# Patient Record
Sex: Female | Born: 1950 | Race: White | Hispanic: No | Smoking: Former smoker
Health system: Southern US, Community
[De-identification: ages and names within clinical notes are randomized; demographics above are authoritative.]

## PROBLEM LIST (undated history)

## (undated) DIAGNOSIS — E669 Obesity, unspecified: Secondary | ICD-10-CM

## (undated) DIAGNOSIS — C801 Malignant (primary) neoplasm, unspecified: Secondary | ICD-10-CM

## (undated) DIAGNOSIS — I739 Peripheral vascular disease, unspecified: Secondary | ICD-10-CM

## (undated) DIAGNOSIS — F329 Major depressive disorder, single episode, unspecified: Secondary | ICD-10-CM

## (undated) DIAGNOSIS — E559 Vitamin D deficiency, unspecified: Secondary | ICD-10-CM

## (undated) DIAGNOSIS — F419 Anxiety disorder, unspecified: Secondary | ICD-10-CM

## (undated) DIAGNOSIS — K219 Gastro-esophageal reflux disease without esophagitis: Secondary | ICD-10-CM

## (undated) DIAGNOSIS — E119 Type 2 diabetes mellitus without complications: Secondary | ICD-10-CM

## (undated) DIAGNOSIS — J449 Chronic obstructive pulmonary disease, unspecified: Secondary | ICD-10-CM

## (undated) DIAGNOSIS — G629 Polyneuropathy, unspecified: Secondary | ICD-10-CM

## (undated) DIAGNOSIS — I252 Old myocardial infarction: Secondary | ICD-10-CM

## (undated) DIAGNOSIS — E785 Hyperlipidemia, unspecified: Secondary | ICD-10-CM

## (undated) DIAGNOSIS — J309 Allergic rhinitis, unspecified: Secondary | ICD-10-CM

## (undated) DIAGNOSIS — M1990 Unspecified osteoarthritis, unspecified site: Secondary | ICD-10-CM

## (undated) DIAGNOSIS — I10 Essential (primary) hypertension: Secondary | ICD-10-CM

## (undated) DIAGNOSIS — L719 Rosacea, unspecified: Secondary | ICD-10-CM

## (undated) DIAGNOSIS — Z87442 Personal history of urinary calculi: Secondary | ICD-10-CM

## (undated) HISTORY — DX: Anxiety disorder, unspecified: F41.9

## (undated) HISTORY — DX: Obesity, unspecified: E66.9

## (undated) HISTORY — DX: Essential (primary) hypertension: I10

## (undated) HISTORY — DX: Major depressive disorder, single episode, unspecified: F32.9

## (undated) HISTORY — DX: Peripheral vascular disease, unspecified: I73.9

## (undated) HISTORY — PX: NASAL SEPTUM SURGERY: SHX37

## (undated) HISTORY — DX: Polyneuropathy, unspecified: G62.9

## (undated) HISTORY — PX: CHOLECYSTECTOMY: SHX55

## (undated) HISTORY — DX: Chronic obstructive pulmonary disease, unspecified: J44.9

## (undated) HISTORY — DX: Unspecified osteoarthritis, unspecified site: M19.90

## (undated) HISTORY — DX: Type 2 diabetes mellitus without complications: E11.9

## (undated) HISTORY — DX: Hyperlipidemia, unspecified: E78.5

## (undated) HISTORY — DX: Allergic rhinitis, unspecified: J30.9

## (undated) HISTORY — PX: TENNIS ELBOW RELEASE/NIRSCHEL PROCEDURE: SHX6651

## (undated) HISTORY — DX: Gastro-esophageal reflux disease without esophagitis: K21.9

## (undated) HISTORY — DX: Rosacea, unspecified: L71.9

## (undated) HISTORY — PX: APPENDECTOMY: SHX54

## (undated) HISTORY — DX: Vitamin D deficiency, unspecified: E55.9

## (undated) HISTORY — PX: ABDOMINAL HYSTERECTOMY: SHX81

## (undated) HISTORY — DX: Old myocardial infarction: I25.2

---

## 2011-12-25 HISTORY — PX: SKIN SURGERY: SHX2413

## 2014-10-29 ENCOUNTER — Ambulatory Visit (INDEPENDENT_AMBULATORY_CARE_PROVIDER_SITE_OTHER): Payer: PRIVATE HEALTH INSURANCE

## 2014-10-29 VITALS — BP 113/64 | HR 88 | Resp 12

## 2014-10-29 DIAGNOSIS — L03011 Cellulitis of right finger: Secondary | ICD-10-CM

## 2014-10-29 DIAGNOSIS — IMO0001 Reserved for inherently not codable concepts without codable children: Secondary | ICD-10-CM

## 2014-10-29 DIAGNOSIS — L03031 Cellulitis of right toe: Secondary | ICD-10-CM

## 2014-10-29 NOTE — Patient Instructions (Signed)
Diabetes and Foot Care Diabetes may cause you to have problems because of poor blood supply (circulation) to your feet and legs. This may cause the skin on your feet to become thinner, break easier, and heal more slowly. Your skin may become dry, and the skin may peel and crack. You may also have nerve damage in your legs and feet causing decreased feeling in them. You may not notice minor injuries to your feet that could lead to infections or more serious problems. Taking care of your feet is one of the most important things you can do for yourself.  HOME CARE INSTRUCTIONS  Wear shoes at all times, even in the house. Do not go barefoot. Bare feet are easily injured.  Check your feet daily for blisters, cuts, and redness. If you cannot see the bottom of your feet, use a mirror or ask someone for help.  Wash your feet with warm water (do not use hot water) and mild soap. Then pat your feet and the areas between your toes until they are completely dry. Do not soak your feet as this can dry your skin.  Apply a moisturizing lotion or petroleum jelly (that does not contain alcohol and is unscented) to the skin on your feet and to dry, brittle toenails. Do not apply lotion between your toes.  Trim your toenails straight across. Do not dig under them or around the cuticle. File the edges of your nails with an emery board or nail file.  Do not cut corns or calluses or try to remove them with medicine.  Wear clean socks or stockings every day. Make sure they are not too tight. Do not wear knee-high stockings since they may decrease blood flow to your legs.  Wear shoes that fit properly and have enough cushioning. To break in new shoes, wear them for just a few hours a day. This prevents you from injuring your feet. Always look in your shoes before you put them on to be sure there are no objects inside.  Do not cross your legs. This may decrease the blood flow to your feet.  If you find a minor scrape,  cut, or break in the skin on your feet, keep it and the skin around it clean and dry. These areas may be cleansed with mild soap and water. Do not cleanse the area with peroxide, alcohol, or iodine.  When you remove an adhesive bandage, be sure not to damage the skin around it.  If you have a wound, look at it several times a day to make sure it is healing.  Do not use heating pads or hot water bottles. They may burn your skin. If you have lost feeling in your feet or legs, you may not know it is happening until it is too late.  Make sure your health care provider performs a complete foot exam at least annually or more often if you have foot problems. Report any cuts, sores, or bruises to your health care provider immediately. SEEK MEDICAL CARE IF:   You have an injury that is not healing.  You have cuts or breaks in the skin.  You have an ingrown nail.  You notice redness on your legs or feet.  You feel burning or tingling in your legs or feet.  You have pain or cramps in your legs and feet.  Your legs or feet are numb.  Your feet always feel cold. SEEK IMMEDIATE MEDICAL CARE IF:   There is increasing redness,   swelling, or pain in or around a wound.  There is a red line that goes up your leg.  Pus is coming from a wound.  You develop a fever or as directed by your health care provider.  You notice a bad smell coming from an ulcer or wound. Document Released: 12/07/2000 Document Revised: 08/12/2013 Document Reviewed: 05/19/2013 ExitCare Patient Information 2015 [employer]. This information is not intended to replace advice given to you by your health care provider. Make sure you discuss any questions you have with your health care provider. ANTIBACTERIAL SOAP INSTRUCTIONS  THE DAY AFTER PROCEDURE  Please follow the instructions your doctor has marked.  Shower as usual. Before getting out, place a drop of antibacterial liquid soap (Dial) on a wet, clean washcloth.   Gently wipe washcloth over affected area.  Afterward, rinse the area with warm water.  Blot the area dry with a soft cloth and cover with antibiotic ointment (neosporin, polysporin, bacitracin) and band aid or gauze and tape Place 3-4 drops of antibacterial liquid soap in a quart of warm tap water.  Submerge foot into water for 20 minutes.  If bandage was applied after your procedure, leave on to allow for easy lift off, then remove and continue with soak for the remaining time.  Next, blot area dry with a soft cloth and cover with a bandage.  Apply other medications as directed by your doctor, such as cortisporin otic solution (eardrops) or neosporin antibiotic ointment 

## 2014-10-29 NOTE — Progress Notes (Signed)
   Subjective:    Patient ID: Erin Carlson, female    DOB: October 30, 1951, 63 y.o.   MRN: [id number]  HPI  PT STATED RT FOOT RT FOOT GREAT TOENAIL IS INFECTED AND SORE FOR 3 WEEKS. THE TOENAIL IS MUCH BETTER BUT WHEN PUTTING PRESSURE IT HURT. TRIED TAKING CEPHALAXINE AND IT HELP.  Review of Systems  Constitutional: Positive for unexpected weight change.  HENT: Positive for sneezing.   Respiratory: Positive for cough and shortness of breath.   Musculoskeletal: Positive for myalgias, back pain, joint swelling and gait problem.  Hematological: Bruises/bleeds easily.  Psychiatric/Behavioral: The patient is nervous/anxious.   All other systems reviewed and are negative.      Objective:   Physical Exam 63 year old white female well-developed well-nourished oriented 3 presents at this time with a history of possible infection lateral nail fold of right foot. Been there for approximately 3 weeks days her daughter who trims her nails trimmed her nails and a few days later while try to remove support or compression stockings a portion of the nail got caught and was pulled back. Increase in bleeding and subsequently developed some redness and drainage and erythema the nail fold. Examination this time reveals small granuloma lateral nail fold no purulence no ascending psoas patient is been on antibiotic regimen for just under week at this point cephalexin 500 mg. Lower extremity objective findings revealed ask her status to be intact pedal pulses palpable DP and PT +2 over 4 bilateral capillary refill time 3 seconds epicritic and proprioceptive sensations intact and symmetric bilateral there is normal plantar response and DTRs patient does have some hyperesthesia are some tingling or burning in her hands and feet at times although not a constant intact sensation is noted motor function is intact and symmetric bilateral nails somewhat criptotic incurvated although no discoloration no friability skin texture  turgor intact and color normal no excessive keratoses no open wounds or ulcers are noted however there is slight skin tear and appears to be a slight hangnail lateral right hallux nail fold. Remainder of exam unremarkable and noncontributory patient again is been applying Neosporin and Band-Aid and taking cephalexin as instructed       Assessment & Plan:  Assessment this time diabetes without complication at this time patient does have paronychia with hangnail lateral nail border right great toe the granulous glomus tissue and hyperkeratotic nail border or nail skin fold along the lateral nail border is debrided at this time after a slight Betadine prep. Small amount of bleeding is encountered was the granulomas tissue is debrided away from the lateral nail edges noted to be intact no break in the skin or the granulomatous tissue have been debrided away did leave a slight pinpoint bleeding which was treated with lumicain and Neosporin Band-Aid dressing applied to the hallux once again. Patient is given instructions for daily cleansing with soap and water and Neosporin and Band-Aid dressing. Advised to complete her anabolic regimen still is about 3-4 days to complete her cephalexin 500 mg 3 times a day. Patient will also maintain accommodative shoes if not resolved completely within the next week to 2 weeks to follow-up as needed next  Harriet Masson DPM

## 2016-03-19 DIAGNOSIS — R42 Dizziness and giddiness: Secondary | ICD-10-CM

## 2016-03-19 DIAGNOSIS — R0602 Shortness of breath: Secondary | ICD-10-CM

## 2016-03-19 DIAGNOSIS — M797 Fibromyalgia: Secondary | ICD-10-CM | POA: Insufficient documentation

## 2016-03-19 DIAGNOSIS — R002 Palpitations: Secondary | ICD-10-CM | POA: Insufficient documentation

## 2016-03-19 HISTORY — DX: Palpitations: R00.2

## 2016-03-19 HISTORY — DX: Shortness of breath: R06.02

## 2016-03-19 HISTORY — DX: Dizziness and giddiness: R42

## 2016-03-19 HISTORY — DX: Fibromyalgia: M79.7

## 2016-06-20 DIAGNOSIS — R899 Unspecified abnormal finding in specimens from other organs, systems and tissues: Secondary | ICD-10-CM | POA: Insufficient documentation

## 2016-06-20 HISTORY — DX: Unspecified abnormal finding in specimens from other organs, systems and tissues: R89.9

## 2017-04-15 DIAGNOSIS — Z01810 Encounter for preprocedural cardiovascular examination: Secondary | ICD-10-CM | POA: Insufficient documentation

## 2017-08-02 ENCOUNTER — Encounter: Payer: Medicare Other | Attending: Psychology | Admitting: Psychology

## 2017-08-02 DIAGNOSIS — E785 Hyperlipidemia, unspecified: Secondary | ICD-10-CM | POA: Diagnosis not present

## 2017-08-02 DIAGNOSIS — Z8673 Personal history of transient ischemic attack (TIA), and cerebral infarction without residual deficits: Secondary | ICD-10-CM | POA: Diagnosis not present

## 2017-08-02 DIAGNOSIS — F419 Anxiety disorder, unspecified: Secondary | ICD-10-CM | POA: Insufficient documentation

## 2017-08-02 DIAGNOSIS — M1990 Unspecified osteoarthritis, unspecified site: Secondary | ICD-10-CM | POA: Insufficient documentation

## 2017-08-02 DIAGNOSIS — G894 Chronic pain syndrome: Secondary | ICD-10-CM | POA: Diagnosis not present

## 2017-08-02 DIAGNOSIS — J449 Chronic obstructive pulmonary disease, unspecified: Secondary | ICD-10-CM | POA: Insufficient documentation

## 2017-08-02 DIAGNOSIS — Z79899 Other long term (current) drug therapy: Secondary | ICD-10-CM | POA: Insufficient documentation

## 2017-08-02 DIAGNOSIS — M797 Fibromyalgia: Secondary | ICD-10-CM | POA: Diagnosis not present

## 2017-08-02 DIAGNOSIS — E119 Type 2 diabetes mellitus without complications: Secondary | ICD-10-CM | POA: Insufficient documentation

## 2017-08-02 DIAGNOSIS — I10 Essential (primary) hypertension: Secondary | ICD-10-CM | POA: Insufficient documentation

## 2017-08-02 DIAGNOSIS — G8929 Other chronic pain: Secondary | ICD-10-CM | POA: Diagnosis present

## 2017-08-02 DIAGNOSIS — F329 Major depressive disorder, single episode, unspecified: Secondary | ICD-10-CM | POA: Diagnosis not present

## 2017-08-02 NOTE — Progress Notes (Signed)
Neuropsychological Consultation   Carlson:   Erin Carlson   DOB:   [birth year]  MR Number:  [id number]  Location:  [facility] [address], [place] [id number] [place] [address] Dept: [phone]           Date of Service:   08/02/2017  Start Time:   9 AM End Time:   10 AM  Provider/Observer:  Ilean Skill, Psy.D.       Clinical Neuropsychologist       Billing Code/Service: [phone] [id number] Units  Chief Complaint:    Erin Carlson was referred by Dr. Maryjean Ka for psychological evaluation as part of Erin standard protocol for evaluation for possible spinal cord stimulator trialing and implantation. 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   Reason for Service:  Erin Carlson is a 66 year old Caucasian female referred by Dr. Luan Pulling for psychological evaluation as part of Erin standard protocol for assessment for possible spinal cord stimulator trialing and possible implantation. Erin Carlson had a back injury in 1992.  Over Erin years she has had many attempts at intervention for her chronic back pain including various shots, physical therapy, chiropractic manipulation and other interventions. She reports that steroids she took caused a great deal of weight gain. Erin Carlson also developed fibromyalgia type symptoms after Erin development of his chronic pain. Erin Carlson reports that Erin fibromyalgia symptoms have been doing much better over time. Erin Carlson does acknowledge that she has times where she gets upset that she is not able to do Erin things that she has done but these are very specific to her frustration and not appear to be part of any significant major depressive event. However,  Erin Carlson has been prescribed Prozac for issues both related to this frustration and depression as well as fibromyalgia symptoms.  Historically, Erin Carlson did have some struggles back in 2006 with regard to depression. During that time her husband left her for a 9 year old and left Erin Carlson in significant financial difficulty with over $100,000 in credit card debt that Erin Carlson did not know was outstanding at Erin time her husband left. Over time she was able to clear this up. Erin Carlson is now on disability and she was able to go through bankruptcy and get these financial issues cleared up. She reports that she doesn't have any stress from those issues ongoing.  Erin Carlson describes Erin major stressors that she has right now is been unable to do normal things like walking or doing Erin dishes and other household chores. She reports that she has back pain and pain going down her right leg and also at times sometimes down her left leg. She describes moderate to significant symptoms of anxiety, stress, appetite changes, sleep disturbance, working issues, insomnia, and low energy.  Current Status:  Erin Carlson has ongoing chronic back pain.  Reliability of Information: Erin information is provided through 1 hour clinical face-to-face interview with Erin Carlson as well as review of available medical records.  Behavioral Observation: Erin Carlson  presents as a 67 y.o.-year-old Right Caucasian Female who appeared her stated age. her dress was Appropriate and she was Well Groomed and her manners were Appropriate to Erin situation.  her  participation was indicative of Appropriate and Attentive behaviors.  There were any physical disabilities noted.  she displayed an appropriate level of cooperation and motivation.     Interactions:    Active Appropriate and Attentive  Attention:   within normal limits and attention span and concentration were age appropriate  Memory:   within normal limits; recent  and remote memory intact  Visuo-spatial:  within normal limits  Speech (Volume):  normal  Speech:   normal;   Thought Process:  Coherent and Relevant  Though Content:  WNL;   Orientation:   person, place, time/date and situation  Judgment:   Good  Planning:   Good  Affect:    Appropriate  Mood:    Anxious  Insight:   Good  Intelligence:   normal  Marital Status/Living: Erin Carlson was born in [place] and grew up in [place]. She is divorced. Erin Carlson is a 46 year old daughter, a 79 year old son, 39 year old son, 1 year old son, and a daughter who is deceased. She currently lives by herself with her dog. She is divorced.  Current Employment: Erin Carlson is disabled at this point and retired because of her back pain.  Past Employment:  Erin Carlson worked for many years in [employer] and also worked in Corporate treasurer, worked as a Chief Operating Officer, and also in Office manager.  Substance Use:  No concerns of substance abuse are reported.    Education:   Engineering geologist History:   Past Medical History:  Diagnosis Date  . Anxiety   . Arthritis   . COPD (chronic obstructive pulmonary disease)   . Diabetes mellitus without complication   . Hyperlipidemia   . Hypertension   . Stroke         Abuse/Trauma History: Erin Carlson denies any significant history of trauma or abuse although she did experience a major stressor in 2006 when her husband left her for a 68 year old female and left Erin Carlson in significant financial distress.  Psychiatric History:  Erin Carlson has been treated for depression and stress back in 2001 as well as 2006. She is also been diagnosed with fibromyalgia as well. Erin Carlson reports that she is doing well now and responds very well to fluoxetine.  Family Med/Psych History: No family history on file.  Risk of Suicide/Violence: virtually non-existent Erin Carlson denies any suicidal or homicidal ideation.  Impression/DX:  Erin  Carlson was referred by Dr. Maryjean Ka for psychological evaluation as part of Erin standard protocol for evaluation for possible spinal cord stimulator trialing and implantation. 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  Erin Carlson's mental status appears to be quite good and she was well oriented. There does not appear to be any overtly disruptive psychological/psychiatric issues at this time although she does have frustration and some mild depression. Erin Carlson appears to be well adjusted and has no history of any significant psychiatric issues except for depressive events related to psychosocial stressors.  Disposition/Plan:  Erin Carlson will complete Erin [place] multiphasic personality inventory as well as Erin P3 inventory as part of this objective psychological evaluation.  Diagnosis:    Chronic pain syndrome  Fibromyalgia         Electronically Signed   _______________________ Ilean Skill, Psy.D.

## 2017-08-08 ENCOUNTER — Encounter: Payer: Medicare Other | Admitting: Psychology

## 2017-08-09 ENCOUNTER — Encounter: Payer: Medicare Other | Admitting: Psychology

## 2017-08-22 ENCOUNTER — Telehealth: Payer: Self-pay | Admitting: Psychology

## 2017-08-22 ENCOUNTER — Encounter (HOSPITAL_BASED_OUTPATIENT_CLINIC_OR_DEPARTMENT_OTHER): Payer: Medicare Other | Admitting: Psychology

## 2017-08-22 DIAGNOSIS — G894 Chronic pain syndrome: Secondary | ICD-10-CM

## 2017-08-22 DIAGNOSIS — M797 Fibromyalgia: Secondary | ICD-10-CM | POA: Diagnosis not present

## 2017-08-22 NOTE — Progress Notes (Signed)
Patient:  Erin Carlson   DOB: [birth year]  MR Number: [id number]  Location: [facility] [address] [place] [address] Dept: [phone]  Start: 3 PM End: 4 PM  Provider/Observer:     Edgardo Roys PSYD  Chief Complaint:      Chief Complaint  Patient presents with  . Pain    Reason For Service:     Harbor Paster is a 66 year old Caucasian female referred by Dr. Luan Pulling for psychological evaluation as part of the standard protocol for assessment for possible spinal cord stimulator trialing and possible implantation. The patient had a back injury in 1992.  Over the years she has had many attempts at intervention for her chronic back pain including various shots, physical therapy, chiropractic manipulation and other interventions. She reports that steroids she took caused a great deal of weight gain. The patient also developed fibromyalgia type symptoms after the development of his chronic pain. The patient reports that the fibromyalgia symptoms have been doing much better over time. The patient does acknowledge that she has times where she gets upset that she is not able to do the things that she has done but these are very specific to her frustration and not appear to be part of any significant major depressive event. However, the patient has been prescribed Prozac for issues both related to this frustration and depression as well as fibromyalgia symptoms.  Historically, the patient did have some struggles back in 2006 with regard to depression. During that time her husband left her for a 17 year old and left the patient in significant financial difficulty with over $100,000 in credit card debt that the patient did not know was outstanding at the time her husband left. Over time she was able to clear this up. The patient is now on disability and she was able to go  through bankruptcy and get these financial issues cleared up. She reports that she doesn't have any stress from those issues ongoing.  The patient describes the major stressors that she has right now is been unable to do normal things like walking or doing the dishes and other household chores. She reports that she has back pain and pain going down her right leg and also at times sometimes down her left leg. She describes moderate to significant symptoms of anxiety, stress, appetite changes, sleep disturbance, working issues, insomnia, and low energy.  Testing Administered:  The patient completed the [place] multiphasic personality inventory-2 as well as the pain patient profile.  Participation Level:   Active  Participation Quality:  Appropriate and Attentive      Behavioral Observation:  Well Groomed, Alert, and Appropriate.   Test Results:   Initially, the patient completed the [place] multiphasic personality inventory. The resulting validity scales do suggest that the patient may have attempted to minimize some of the overall difficulties and stressors that she has been dealing with but this is not to an extreme level. The patient did attempt to place herself in a positive light but at a level that would allow interpretation of the basic clinical scales.  The resulting clinical scales do show specific and targeted elevations in both very vague as well as specific health concerns and physical difficulties. The patient denies any significant level of depression or anxiety or other significant psychiatric conditions. The issues that the patient likely minimized had to do with frustration and difficulties coping with her level  of pain and difficulties. However, it does not appear to be an exaggeration of her physical pain are physical difficulties and does not suggest any type of somatic sedation or somatoform disorder. The patient does not appear to have any significant psychiatric illness or  overly disruptive psychiatric/psychological symptomatology.  Further analysis utilizing supplementary scales do show elevations on measures related to a great deal of coping resources being utilized to try to control anger and hostility feelings and a need to feel dominant in the world that she is feeling like she does not have much control over. This hostility and anger may be tied in December the psychosocial features that she has face the years including issues related to a very difficult into her marriage where her husband left and left the patient in significant financial difficulties through direct manipulation of her portion her into bankruptcy.  There are no indications of significant PTSD or other history of extreme trauma or traumatic experiences. The patient does not have an elevation in measures that are associated with vulnerability to substance abuse.  The patient then completed the pain patient profile. This is an objective measure that helps adjust for some of the elevations that can be found on the MMPI-2 related to chronic pain patients as the normative samples for the MMPI 2 do not contain chronic pain patients. The patient does appear to produced a valid assessment. The patient's level of somatic/physical complaints are significantly above a community-based normative sample. However, there accident below the mean level typically found in a chronic pain patient sample that does not contain individuals with significant psychiatric illness. The patient also had a lower level of anxiety than both the community-based sample or the chronic pain patient population. She did have increased levels of symptoms related to depressive features relative to a normative/community sample but below those typically seen in chronic pain patients.  Summary of Results:   Results of the current psychological evaluation that include a 1 hour face-to-face clinical interview, review of available medical records, as  well as objective assessments including the MMPI-2 as well as the pain patient profile are consistent with an individual who has had some difficulties with depressive symptoms but has been working very hard to manage and cope with these symptoms. The patient has some degree of underlying anger and hostility that may be related to factors from 2006 or other factors associated with situations where important people in her life have taken control away from her and left are very angry and frustrated. The level of depressive symptoms are not significant or severe and are not indicative of significant underlying psychiatric illness. There are no indications of significant anxiety, psychosis, or manic types of symptoms. There also does not appear to be significant vulnerability for substance abuse. The patient's profile is consistent with an individual with chronic pain symptoms but there are also some vague symptoms described that are consistent with a prior diagnosis of fibromyalgia. Overall, there do not appear to be any significant psychiatric or psychosocial variables that would be overly deleterious to the trialing. Up the spinal cord stimulator or disruptive if actual implantation was completed.  Impression/Diagnosis:   The results of the current objective psychological evaluation are consistent with an individual who would be a good candidate for spinal cord stimulator trialing and possible implantation. There do not appear to be any significant psychological/psychiatric variables or psychosocial variables that would be overly disruptive and not allow for accurate descriptions from the patient regarding the trialing phase. There  also do not appear to be significant issues that would disrupt or impede her ability to proceed with implantation of that was warranted. The patient does have some mild to moderate symptoms of depression that appear to be well managed and not particularly acute at this time. The patient  does also have some degree of underlying anger and hostility likely from life experiences and the patient is using a reasonable amount of coping resources to try to cope with and deal with this hostility from being externalized. The patient may benefit from working on some of the symptoms although she does report that she has been coping much better with the disruptive events in her life dating back to 2006 when her husband left her after 22 years and left are insignificant severe financial distress.  Diagnosis:    Axis I: Chronic pain syndrome  Fibromyalgia   Ilean Skill, Psy.D. Neuropsychologist

## 2017-08-22 NOTE — Telephone Encounter (Signed)
Erin Carlson STILL NEEDS NOTES FOR DR Cary

## 2017-10-01 ENCOUNTER — Other Ambulatory Visit: Payer: Self-pay | Admitting: Anesthesiology

## 2017-10-23 NOTE — Pre-Procedure Instructions (Signed)
Maisha Juma  10/23/2017      [facility], [place] [address] Phone: [phone] Fax: [phone]    Your procedure is scheduled on Friday November 9.  Report to [facility] Admitting at 7:30 A.M.  Call this number if you have problems the morning of surgery:  [phone]   Remember:  Do not eat food or drink liquids after midnight.  Take these medicines the morning of surgery with A SIP OF WATER:  Metoprolol (lopressor) Omeprazole (prilosec) pregabalin (lyrica) Fluoxetine (prozac) Albuterol Inhaler if needed Acetaminophen (tylenol) if needed Cyclobenzaprine (flexeril) if needed  7 days prior to surgery STOP taking any Aspirin (unless otherwise instructed by your surgeon), Aleve, Naproxen, Ibuprofen, Motrin, Advil, Goody's, BC's, all herbal medications, fish oil, and all vitamins    Do not wear jewelry, make-up or nail polish.  Do not wear lotions, powders, or perfumes, or deoderant.  Do not shave 48 hours prior to surgery.  Men may shave face and neck.  Do not bring valuables to the hospital.  [facility] is not responsible for any belongings or valuables.  Contacts, dentures or bridgework may not be worn into surgery.  Leave your suitcase in the car.  After surgery it may be brought to your room.  For patients admitted to the hospital, discharge time will be determined by your treatment team.  Patients discharged the day of surgery will not be allowed to drive home.    Special instructions:    [facility]- Preparing For Surgery  Before surgery, you can play an important role. Because skin is not sterile, your skin needs to be as free of germs as possible. You can reduce the number of germs on your skin by washing with CHG (chlorahexidine gluconate) Soap before surgery.  CHG is an antiseptic cleaner which kills germs and bonds with the skin to continue killing germs even after  washing.  Please do not use if you have an allergy to CHG or antibacterial soaps. If your skin becomes reddened/irritated stop using the CHG.  Do not shave (including legs and underarms) for at least 48 hours prior to first CHG shower. It is OK to shave your face.  Please follow these instructions carefully.   1. Shower the NIGHT BEFORE SURGERY and the MORNING OF SURGERY with CHG.   2. If you chose to wash your hair, wash your hair first as usual with your normal shampoo.  3. After you shampoo, rinse your hair and body thoroughly to remove the shampoo.  4. Use CHG as you would any other liquid soap. You can apply CHG directly to the skin and wash gently with a scrungie or a clean washcloth.   5. Apply the CHG Soap to your body ONLY FROM THE NECK DOWN.  Do not use on open wounds or open sores. Avoid contact with your eyes, ears, mouth and genitals (private parts). Wash Face and genitals (private parts)  with your normal soap.  6. Wash thoroughly, paying special attention to the area where your surgery will be performed.  7. Thoroughly rinse your body with warm water from the neck down.  8. DO NOT shower/wash with your normal soap after using and rinsing off the CHG Soap.  9. Pat yourself dry with a CLEAN TOWEL.  10. Wear CLEAN PAJAMAS to bed the night before surgery, wear comfortable clothes the morning of surgery  11. Place CLEAN SHEETS on  your bed the night of your first shower and DO NOT SLEEP WITH PETS.    Day of Surgery: Do not apply any deodorants/lotions. Please wear clean clothes to the hospital/surgery center.      Please read over the following fact sheets that you were given. Coughing and Deep Breathing and MRSA Information

## 2017-10-24 ENCOUNTER — Encounter (HOSPITAL_COMMUNITY)
Admission: RE | Admit: 2017-10-24 | Discharge: 2017-10-24 | Disposition: A | Discharge status: Home / Self Care | Payer: Medicare Other | Source: Ambulatory Visit | Attending: Anesthesiology | Admitting: Anesthesiology

## 2017-10-24 ENCOUNTER — Encounter (HOSPITAL_COMMUNITY): Payer: Self-pay

## 2017-10-24 DIAGNOSIS — G894 Chronic pain syndrome: Secondary | ICD-10-CM | POA: Diagnosis not present

## 2017-10-24 DIAGNOSIS — Z01818 Encounter for other preprocedural examination: Secondary | ICD-10-CM | POA: Insufficient documentation

## 2017-10-24 DIAGNOSIS — M797 Fibromyalgia: Secondary | ICD-10-CM | POA: Diagnosis not present

## 2017-10-24 DIAGNOSIS — Z7982 Long term (current) use of aspirin: Secondary | ICD-10-CM | POA: Insufficient documentation

## 2017-10-24 DIAGNOSIS — Z79899 Other long term (current) drug therapy: Secondary | ICD-10-CM | POA: Diagnosis not present

## 2017-10-24 HISTORY — DX: Personal history of urinary calculi: Z87.442

## 2017-10-24 HISTORY — PX: EYE SURGERY: SHX253

## 2017-10-24 HISTORY — DX: Malignant (primary) neoplasm, unspecified: C80.1

## 2017-10-24 LAB — BASIC METABOLIC PANEL
Anion gap: 8 (ref 5–15)
BUN: 17 mg/dL (ref 6–20)
CALCIUM: 9.4 mg/dL (ref 8.9–10.3)
CO2: 25 mmol/L (ref 22–32)
CREATININE: 1.08 mg/dL — AB (ref 0.44–1.00)
Chloride: 104 mmol/L (ref 101–111)
GFR calc non Af Amer: 52 mL/min — ABNORMAL LOW (ref >60)
GFR, EST AFRICAN AMERICAN: 60 mL/min — AB (ref >60)
GLUCOSE: 111 mg/dL — AB (ref 65–99)
Potassium: 4.8 mmol/L (ref 3.5–5.1)
Sodium: 137 mmol/L (ref 135–145)

## 2017-10-24 LAB — CBC
HEMATOCRIT: 44.8 % (ref 36.0–46.0)
Hemoglobin: 14.5 g/dL (ref 12.0–15.0)
MCH: 28.5 pg (ref 26.0–34.0)
MCHC: 32.4 g/dL (ref 30.0–36.0)
MCV: 88 fL (ref 78.0–100.0)
Platelets: 198 10*3/uL (ref 150–400)
RBC: 5.09 MIL/uL (ref 3.87–5.11)
RDW: 14.6 % (ref 11.5–15.5)
WBC: 7.9 10*3/uL (ref 4.0–10.5)

## 2017-10-24 LAB — PROTIME-INR
INR: 1
Prothrombin Time: 13.1 seconds (ref 11.4–15.2)

## 2017-10-24 LAB — SURGICAL PCR SCREEN
MRSA, PCR: NEGATIVE
STAPHYLOCOCCUS AUREUS: POSITIVE — AB

## 2017-10-24 LAB — HEMOGLOBIN A1C
Hgb A1c MFr Bld: 6.7 % — ABNORMAL HIGH (ref 4.8–5.6)
Mean Plasma Glucose: 145.59 mg/dL

## 2017-10-24 LAB — GLUCOSE, CAPILLARY: Glucose-Capillary: 105 mg/dL — ABNORMAL HIGH (ref 65–99)

## 2017-10-24 LAB — APTT: aPTT: 29 seconds (ref 24–36)

## 2017-10-24 MED ORDER — CHLORHEXIDINE GLUCONATE CLOTH 2 % EX PADS: 6.0000 | MEDICATED_PAD | Freq: Once | CUTANEOUS | Status: DC

## 2017-10-24 NOTE — Progress Notes (Addendum)
PCP: Heide Scales, NP  Cardiologist: Dr. Clea[facility] in [place] (pt believes this MD has left the practice but she saw him at this practice and has only been seen at this practice for cardiology)  EKG: 03/2017-requested  Stress test: 04/16/17-care everywhere-requested  ECHO: 03/27/16 -care everywhere-requested  Cardiac Cath: pt denies  Chest x-ray: pt denies past year, no recent respiratory complications

## 2017-10-25 NOTE — Progress Notes (Addendum)
Anesthesia Chart Review:  Pt is a 66 year old female scheduled for lumbar spinal cord stimulator insertion on 11/01/2017 with Clydell Hakim, M.D.  - PCP is Heide Scales, NP  PMH includes: HTN, DM, hyperlipidemia, COPD. Former smoker. BMI 39.   Medications include: Albuterol, ASA 81 mg, exenatide, metoprolol, Prilosec, pravastatin  BP 107/63   Pulse 79   Temp 36.5 C (Oral)   Resp 18   Ht 5\' 3"  (1.6 m)   Wt 220 lb 7 oz (100 kg)   SpO2 95%   BMI 39.05 kg/m    Preoperative labs reviewed.  HbA1c 6.7, glucose 111  EKG will be obtained day of surgery  Nuclear stress test 04/16/17 ([place]):  - Printed copy illegible.  I spoke to office. Report states " no evidence of ischemia. Preserved systolic function. EF 57%"  Echo 03/27/16:  - by notes in care everywhere (Dr. Agustin Cree 05/09/16), "Echo [place] showed ejection fraction 45-50"  Holter monitor 03/27/16:  1.Essentially normal, no significant abnormalities Holter monitor.  2.Arrhythmia as described 3.Symptoms were not reported  4.Symptoms were not correlated with arrhythmias  If EKG acceptable, I anticipate pt can proceed with surgery as scheduled.    Willeen Cass, FNP-BC [facility]/Anesthesiology Phone: [phone] 10/31/2017 9:57 AM

## 2017-10-31 NOTE — H&P (Signed)
Erin Carlson is an 66 y.o. female.   Chief Complaint: Back pain with radiation into the lower extremities, right substantially more than left HPI: Erin Carlson is a very pleasant obese 66 year old right-handed Caucasian female who presents to the clinic today with a past medical history significant for anxiety, depression, hypertension, high cholesterol, GERD, liver disease, arthritis, diabetes, renal disease, osteoporosis as well as COPD.  She is managed for chronic pain by Leonie Green, NP.  She receives multiple medications to include naproxen, gabapentin, amitriptyline, Lyrica and Amitiza.  She was sent to our clinic for consideration of spinal cord stimulation therapy.  The patient has had the opportunity to speak with one of our representatives on the phone.  She was able to get some of her questions answered.  Today the patient presents essentially complaining of pain all across her low back and radiating down into her right lower extremity.  She has pain radiating into both the right and left hip.  She states that while the symptoms are worse on the right they are beginning to progress on left.  She has pain radiating all the way down into the right foot.  She would describe the pain as a sharp shooting sensation with a fairly constant achy gnawing pain. She rates her pain approximately an 8/10.  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  Her functionality has slowly declined the patient is now disabled.  The she has pain when lying, sitting, standing or walking.  She finds that medications, hot showers and Aspercreme helps to reduce the symptoms.  She has difficulty tolerating pain medications such as oxycodone or hydrocodone as she has allergies with these.  She finds activities such as walking, which she previously really enjoyed, to be very difficult.  She used to walk 2 mile circuit with her dog but is now unable to walk even short  distances without severe pain. Activities that require standing or sitting for any length of time such as driving or washing dishes aggravate her pain.  She states she cannot sit more than about a half an hour, stand more than 10 to 20 minutes or walk more than 3 minutes without having to sit to rest.  She reports no bowel or bladder dysfunction.  She has trialed 15 to 20 physical therapy sessions with no benefit.  She also reports trialing 12 to 15 injections of various kinds again with no benefit.  She states the most recent injection actually caused her to have fairly severe pain for about a week afterwards before her pain returned to its baseline.    With this history, and  With no reported absolute surgical lesion, she was referred for S CS trial.  She underwent that trial, and returned reporting some 90 percent improvement in her back pain and leg pain.  She now presents for permanent implantation of SCS system   Past Medical History:  Diagnosis Date  . Anxiety   . Arthritis   . Cancer ([place])    basal cell 2016  . COPD (chronic obstructive pulmonary disease) ([place])   . Diabetes mellitus without complication ([place])   . History of kidney stones    10/2016  . Hyperlipidemia   . Hypertension     Past Surgical History:  Procedure Laterality Date  . ABDOMINAL HYSTERECTOMY     1989  . CHOLECYSTECTOMY     1982  . NASAL SEPTUM SURGERY  2018   . [place]    History reviewed. No pertinent family history. Social History:  reports that she quit smoking about 11 years ago. She has a 30.00 pack-year smoking history. she has never used smokeless tobacco. She reports that she does not drink alcohol or use drugs.  Allergies:  Allergies  Allergen Reactions  . Cortisone Hives  . Hydrocodone Hives  . Oxycodone Hives  . Paxil [Paroxetine Hcl]     Hypotension   . Tramadol Hives    Medications Prior to Admission  Medication Sig Dispense Refill  .  acetaminophen (TYLENOL) 650 MG CR tablet Take 650 mg by mouth every 8 (eight) hours as needed for pain.    Marland Kitchen albuterol (PROAIR HFA) 108 (90 BASE) MCG/ACT inhaler Inhale 2 puffs into the lungs every 6 (six) hours as needed for wheezing or shortness of breath.     Marland Kitchen aspirin EC 81 MG tablet Take 81 mg by mouth daily.    . Cetirizine HCl 10 MG CAPS Take 10 mg by mouth daily.     . Cholecalciferol (VITAMIN D3) 2000 units capsule Take 2,000 Units by mouth daily.    . cyclobenzaprine (FLEXERIL) 10 MG tablet Take 10 mg by mouth at bedtime as needed for muscle spasms.     . diphenhydramine-acetaminophen (TYLENOL PM) 25-500 MG TABS tablet Take 2 tablets by mouth at bedtime as needed (sleep).    . Exenatide ER (BYDUREON) 2 MG PEN Inject 2 mg into the skin every Saturday.    Marland Kitchen FLUoxetine (PROZAC) 20 MG capsule Take 20 mg by mouth daily.    Marland Kitchen linaclotide (LINZESS) 72 MCG capsule Take 72 mcg by mouth daily.    . metoprolol tartrate (LOPRESSOR) 25 MG tablet Take 25 mg by mouth 2 (two) times daily.    . Multiple Vitamin (MULTIVITAMIN WITH MINERALS) TABS tablet Take 1 tablet by mouth daily.    . naproxen (NAPROSYN) 500 MG tablet Take 500 mg by mouth 2 (two) times daily with a meal.    . Omega-3 Fatty Acids (FISH OIL) 1000 MG CAPS Take 1,000 mg by mouth daily.    Marland Kitchen omeprazole (PRILOSEC) 20 MG capsule Take 20 mg by mouth daily.    . pravastatin (PRAVACHOL) 20 MG tablet Take 20 mg by mouth daily.    . pregabalin (LYRICA) 100 MG capsule Take 100 mg by mouth 2 (two) times daily.    . Tetrahydrozoline HCl (VISINE OP) Apply 1 drop to eye 2 (two) times daily.      Results for orders placed or performed during the hospital encounter of 11/01/17 (from the past 48 hour(s))  Glucose, capillary     Status: Abnormal   Collection Time: 11/01/17  8:28 AM  Result Value Ref Range   Glucose-Capillary 103 (H) 65 - 99 mg/dL   No results found.  Review of Systems  Constitutional: Negative.   HENT: Negative.   Eyes:  Negative.   Respiratory: Negative.   Cardiovascular: Negative.   Gastrointestinal: Negative.   Genitourinary: Negative.   Musculoskeletal: Positive for back pain. Negative for falls and myalgias.  Skin: Negative.   Neurological: Negative.   Endo/Heme/Allergies: Negative.   Psychiatric/Behavioral: Negative.     Blood pressure (!) 133/50, pulse 80, temperature 97.7 F (36.5 C), temperature source Oral, resp. rate 20, SpO2 96 %. Physical Exam  Constitutional: She appears well-developed and well-nourished.  HENT:  Head: Normocephalic and atraumatic.  Eyes: EOM are normal. Pupils are equal,  round, and reactive to light.  Neck: Normal range of motion.  Cardiovascular: Normal rate.  Respiratory: Effort normal.     Assessment/Plan  chronic pain syndrome, chronic low back pain, radiculopathy  Plan:  Implant permanent SCS, [place], MD 11/01/2017, 9:41 AM

## 2017-11-01 ENCOUNTER — Ambulatory Visit (HOSPITAL_COMMUNITY): Payer: Medicare Other | Admitting: Emergency Medicine

## 2017-11-01 ENCOUNTER — Encounter (HOSPITAL_COMMUNITY)
Admission: RE | Disposition: A | Discharge status: Home / Self Care | Payer: Self-pay | Source: Ambulatory Visit | Attending: Anesthesiology

## 2017-11-01 ENCOUNTER — Encounter (HOSPITAL_COMMUNITY): Payer: Self-pay | Admitting: Anesthesiology

## 2017-11-01 ENCOUNTER — Ambulatory Visit (HOSPITAL_COMMUNITY): Payer: Medicare Other | Admitting: Anesthesiology

## 2017-11-01 ENCOUNTER — Ambulatory Visit (HOSPITAL_COMMUNITY)
Admission: RE | Admit: 2017-11-01 | Discharge: 2017-11-01 | Disposition: A | Discharge status: Home / Self Care | Payer: Medicare Other | Source: Ambulatory Visit | Attending: Anesthesiology | Admitting: Anesthesiology

## 2017-11-01 ENCOUNTER — Ambulatory Visit (HOSPITAL_COMMUNITY): Payer: Medicare Other

## 2017-11-01 DIAGNOSIS — F419 Anxiety disorder, unspecified: Secondary | ICD-10-CM | POA: Insufficient documentation

## 2017-11-01 DIAGNOSIS — N289 Disorder of kidney and ureter, unspecified: Secondary | ICD-10-CM | POA: Insufficient documentation

## 2017-11-01 DIAGNOSIS — J449 Chronic obstructive pulmonary disease, unspecified: Secondary | ICD-10-CM | POA: Insufficient documentation

## 2017-11-01 DIAGNOSIS — Z79899 Other long term (current) drug therapy: Secondary | ICD-10-CM | POA: Diagnosis not present

## 2017-11-01 DIAGNOSIS — E119 Type 2 diabetes mellitus without complications: Secondary | ICD-10-CM | POA: Diagnosis not present

## 2017-11-01 DIAGNOSIS — M1990 Unspecified osteoarthritis, unspecified site: Secondary | ICD-10-CM | POA: Insufficient documentation

## 2017-11-01 DIAGNOSIS — Z7982 Long term (current) use of aspirin: Secondary | ICD-10-CM | POA: Diagnosis not present

## 2017-11-01 DIAGNOSIS — E669 Obesity, unspecified: Secondary | ICD-10-CM | POA: Diagnosis not present

## 2017-11-01 DIAGNOSIS — Z885 Allergy status to narcotic agent status: Secondary | ICD-10-CM | POA: Diagnosis not present

## 2017-11-01 DIAGNOSIS — Z888 Allergy status to other drugs, medicaments and biological substances status: Secondary | ICD-10-CM | POA: Insufficient documentation

## 2017-11-01 DIAGNOSIS — M545 Low back pain: Secondary | ICD-10-CM | POA: Diagnosis not present

## 2017-11-01 DIAGNOSIS — I10 Essential (primary) hypertension: Secondary | ICD-10-CM | POA: Insufficient documentation

## 2017-11-01 DIAGNOSIS — G894 Chronic pain syndrome: Secondary | ICD-10-CM | POA: Diagnosis not present

## 2017-11-01 DIAGNOSIS — Z85828 Personal history of other malignant neoplasm of skin: Secondary | ICD-10-CM | POA: Insufficient documentation

## 2017-11-01 DIAGNOSIS — Z87891 Personal history of nicotine dependence: Secondary | ICD-10-CM | POA: Diagnosis not present

## 2017-11-01 DIAGNOSIS — E7800 Pure hypercholesterolemia, unspecified: Secondary | ICD-10-CM | POA: Insufficient documentation

## 2017-11-01 DIAGNOSIS — Z794 Long term (current) use of insulin: Secondary | ICD-10-CM | POA: Diagnosis not present

## 2017-11-01 DIAGNOSIS — Z9049 Acquired absence of other specified parts of digestive tract: Secondary | ICD-10-CM | POA: Insufficient documentation

## 2017-11-01 DIAGNOSIS — K219 Gastro-esophageal reflux disease without esophagitis: Secondary | ICD-10-CM | POA: Insufficient documentation

## 2017-11-01 DIAGNOSIS — M5416 Radiculopathy, lumbar region: Secondary | ICD-10-CM | POA: Diagnosis not present

## 2017-11-01 DIAGNOSIS — Z87442 Personal history of urinary calculi: Secondary | ICD-10-CM | POA: Diagnosis not present

## 2017-11-01 DIAGNOSIS — K769 Liver disease, unspecified: Secondary | ICD-10-CM | POA: Insufficient documentation

## 2017-11-01 DIAGNOSIS — M810 Age-related osteoporosis without current pathological fracture: Secondary | ICD-10-CM | POA: Diagnosis not present

## 2017-11-01 DIAGNOSIS — Z90710 Acquired absence of both cervix and uterus: Secondary | ICD-10-CM | POA: Insufficient documentation

## 2017-11-01 DIAGNOSIS — Z419 Encounter for procedure for purposes other than remedying health state, unspecified: Secondary | ICD-10-CM

## 2017-11-01 DIAGNOSIS — F329 Major depressive disorder, single episode, unspecified: Secondary | ICD-10-CM | POA: Diagnosis not present

## 2017-11-01 HISTORY — PX: SPINAL CORD STIMULATOR INSERTION: SHX5378

## 2017-11-01 LAB — GLUCOSE, CAPILLARY
Glucose-Capillary: 103 mg/dL — ABNORMAL HIGH (ref 65–99)
Glucose-Capillary: 126 mg/dL — ABNORMAL HIGH (ref 65–99)

## 2017-11-01 SURGERY — INSERTION, SPINAL CORD STIMULATOR, LUMBAR
Anesthesia: General

## 2017-11-01 MED ORDER — BUPIVACAINE-EPINEPHRINE (PF) 0.5% -1:200000 IJ SOLN
INTRAMUSCULAR | Status: DC | PRN
  Administered 2017-11-01: 27 mL

## 2017-11-01 MED ORDER — FENTANYL CITRATE (PF) 100 MCG/2ML IJ SOLN: 25.0000 ug | INTRAMUSCULAR | Status: DC | PRN

## 2017-11-01 MED ORDER — KETOROLAC TROMETHAMINE 30 MG/ML IJ SOLN
INTRAMUSCULAR | Status: AC
  Administered 2017-11-01: 30 mg via INTRAVENOUS
  Filled 2017-11-01: qty 1

## 2017-11-01 MED ORDER — DEXAMETHASONE SODIUM PHOSPHATE 10 MG/ML IJ SOLN
INTRAMUSCULAR | Status: AC
  Filled 2017-11-01: qty 1

## 2017-11-01 MED ORDER — CLINDAMYCIN HCL 150 MG PO CAPS: 150.0000 mg | ORAL_CAPSULE | Freq: Three times a day (TID) | ORAL | 0 refills | Status: AC

## 2017-11-01 MED ORDER — PROPOFOL 10 MG/ML IV BOLUS
INTRAVENOUS | Status: AC
  Filled 2017-11-01: qty 20

## 2017-11-01 MED ORDER — ROCURONIUM BROMIDE 100 MG/10ML IV SOLN
INTRAVENOUS | Status: DC | PRN
  Administered 2017-11-01: 50 mg via INTRAVENOUS

## 2017-11-01 MED ORDER — LACTATED RINGERS IV SOLN
INTRAVENOUS | Status: DC | PRN
  Administered 2017-11-01 (×2): via INTRAVENOUS

## 2017-11-01 MED ORDER — BACITRACIN-NEOMYCIN-POLYMYXIN 400-5-5000 EX OINT
TOPICAL_OINTMENT | CUTANEOUS | Status: AC
  Filled 2017-11-01: qty 1

## 2017-11-01 MED ORDER — CEFAZOLIN SODIUM-DEXTROSE 2-4 GM/100ML-% IV SOLN
2.0000 g | INTRAVENOUS | Status: AC
  Administered 2017-11-01: 2 g via INTRAVENOUS
  Filled 2017-11-01: qty 100

## 2017-11-01 MED ORDER — FENTANYL CITRATE (PF) 250 MCG/5ML IJ SOLN
INTRAMUSCULAR | Status: AC
  Filled 2017-11-01: qty 5

## 2017-11-01 MED ORDER — MORPHINE SULFATE 15 MG PO TABS: ORAL_TABLET | ORAL | 0 refills | Status: DC

## 2017-11-01 MED ORDER — ONDANSETRON HCL 4 MG/2ML IJ SOLN
INTRAMUSCULAR | Status: DC | PRN
  Administered 2017-11-01: 4 mg via INTRAVENOUS

## 2017-11-01 MED ORDER — DEXAMETHASONE SODIUM PHOSPHATE 10 MG/ML IJ SOLN
INTRAMUSCULAR | Status: DC | PRN
  Administered 2017-11-01: 4 mg via INTRAVENOUS

## 2017-11-01 MED ORDER — KETOROLAC TROMETHAMINE 30 MG/ML IJ SOLN
30.0000 mg | Freq: Once | INTRAMUSCULAR | Status: AC
  Administered 2017-11-01: 30 mg via INTRAVENOUS

## 2017-11-01 MED ORDER — LIDOCAINE 2% (20 MG/ML) 5 ML SYRINGE
INTRAMUSCULAR | Status: AC
  Filled 2017-11-01: qty 5

## 2017-11-01 MED ORDER — ONDANSETRON HCL 4 MG/2ML IJ SOLN
INTRAMUSCULAR | Status: AC
  Filled 2017-11-01: qty 2

## 2017-11-01 MED ORDER — SUGAMMADEX SODIUM 200 MG/2ML IV SOLN
INTRAVENOUS | Status: DC | PRN
  Administered 2017-11-01: 200 mg via INTRAVENOUS

## 2017-11-01 MED ORDER — BACITRACIN 50000 UNITS IM SOLR
INTRAMUSCULAR | Status: DC | PRN
  Administered 2017-11-01: 500 mL

## 2017-11-01 MED ORDER — MIDAZOLAM HCL 2 MG/2ML IJ SOLN
INTRAMUSCULAR | Status: AC
  Filled 2017-11-01: qty 2

## 2017-11-01 MED ORDER — PROPOFOL 10 MG/ML IV BOLUS
INTRAVENOUS | Status: DC | PRN
  Administered 2017-11-01: 160 mg via INTRAVENOUS

## 2017-11-01 MED ORDER — SUGAMMADEX SODIUM 200 MG/2ML IV SOLN
INTRAVENOUS | Status: AC
  Filled 2017-11-01: qty 2

## 2017-11-01 MED ORDER — FENTANYL CITRATE (PF) 100 MCG/2ML IJ SOLN
INTRAMUSCULAR | Status: AC
  Filled 2017-11-01: qty 2

## 2017-11-01 MED ORDER — ONDANSETRON HCL 4 MG/2ML IJ SOLN: 4.0000 mg | Freq: Once | INTRAMUSCULAR | Status: DC | PRN

## 2017-11-01 MED ORDER — BUPIVACAINE-EPINEPHRINE (PF) 0.5% -1:200000 IJ SOLN
INTRAMUSCULAR | Status: AC
  Filled 2017-11-01: qty 30

## 2017-11-01 MED ORDER — LIDOCAINE HCL (CARDIAC) 20 MG/ML IV SOLN
INTRAVENOUS | Status: DC | PRN
  Administered 2017-11-01: 40 mg via INTRAVENOUS

## 2017-11-01 MED ORDER — FENTANYL CITRATE (PF) 100 MCG/2ML IJ SOLN
INTRAMUSCULAR | Status: DC | PRN
  Administered 2017-11-01: 100 ug via INTRAVENOUS
  Administered 2017-11-01: 50 ug via INTRAVENOUS

## 2017-11-01 SURGICAL SUPPLY — 59 items
ANCHOR CLIK X NEURO (Stimulator) ×2 IMPLANT
BAG DECANTER FOR FLEXI CONT (MISCELLANEOUS) ×2 IMPLANT
BENZOIN TINCTURE PRP APPL 2/3 (GAUZE/BANDAGES/DRESSINGS) IMPLANT
BINDER ABDOMINAL 12 ML 46-62 (SOFTGOODS) IMPLANT
BLADE CLIPPER SURG (BLADE) IMPLANT
CHLORAPREP W/TINT 26ML (MISCELLANEOUS) ×2 IMPLANT
CLIP VESOCCLUDE SM WIDE 6/CT (CLIP) IMPLANT
DERMABOND ADVANCED (GAUZE/BANDAGES/DRESSINGS)
DERMABOND ADVANCED .7 DNX12 (GAUZE/BANDAGES/DRESSINGS) IMPLANT
DRAPE C-ARM 42X72 X-RAY (DRAPES) ×2 IMPLANT
DRAPE C-ARMOR (DRAPES) ×2 IMPLANT
DRAPE LAPAROTOMY 100X72X124 (DRAPES) ×2 IMPLANT
DRAPE POUCH INSTRU U-SHP 10X18 (DRAPES) ×2 IMPLANT
DRAPE SURG 17X23 STRL (DRAPES) ×2 IMPLANT
DRSG OPSITE POSTOP 3X4 (GAUZE/BANDAGES/DRESSINGS) ×4 IMPLANT
ELECT REM PT RETURN 9FT ADLT (ELECTROSURGICAL) ×2
ELECTRODE REM PT RTRN 9FT ADLT (ELECTROSURGICAL) ×1 IMPLANT
GAUZE SPONGE 4X4 16PLY XRAY LF (GAUZE/BANDAGES/DRESSINGS) IMPLANT
GENERATOR NEUROSTIMULATOR (Neurostimulator) ×2 IMPLANT
GLOVE BIOGEL PI IND STRL 7.5 (GLOVE) ×1 IMPLANT
GLOVE BIOGEL PI INDICATOR 7.5 (GLOVE) ×1
GLOVE ECLIPSE 7.5 STRL STRAW (GLOVE) ×2 IMPLANT
GLOVE EXAM NITRILE LRG STRL (GLOVE) IMPLANT
GLOVE EXAM NITRILE XL STR (GLOVE) IMPLANT
GLOVE EXAM NITRILE XS STR PU (GLOVE) IMPLANT
GOWN STRL REUS W/ TWL LRG LVL3 (GOWN DISPOSABLE) IMPLANT
GOWN STRL REUS W/ TWL XL LVL3 (GOWN DISPOSABLE) IMPLANT
GOWN STRL REUS W/TWL 2XL LVL3 (GOWN DISPOSABLE) IMPLANT
GOWN STRL REUS W/TWL LRG LVL3 (GOWN DISPOSABLE)
GOWN STRL REUS W/TWL XL LVL3 (GOWN DISPOSABLE)
KIT BASIN OR (CUSTOM PROCEDURE TRAY) ×2 IMPLANT
KIT CHARGING (KITS) ×1
KIT CHARGING PRECISION NEURO (KITS) ×1 IMPLANT
KIT REMOTE CONTROL 112802 FREE (KITS) ×2 IMPLANT
KIT ROOM TURNOVER OR (KITS) ×2 IMPLANT
LEAD INFINION CX PERC 70CM (Cable) ×4 IMPLANT
NEEDLE 18GX1X1/2 (RX/OR ONLY) (NEEDLE) IMPLANT
NEEDLE ENTRADA 4.5IN (NEEDLE) ×4 IMPLANT
NEEDLE HYPO 25X1 1.5 SAFETY (NEEDLE) ×2 IMPLANT
NS IRRIG 1000ML POUR BTL (IV SOLUTION) ×2 IMPLANT
PACK LAMINECTOMY NEURO (CUSTOM PROCEDURE TRAY) ×2 IMPLANT
PAD ARMBOARD 7.5X6 YLW CONV (MISCELLANEOUS) ×2 IMPLANT
SPONGE LAP 4X18 X RAY DECT (DISPOSABLE) IMPLANT
SPONGE SURGIFOAM ABS GEL SZ50 (HEMOSTASIS) IMPLANT
STAPLER SKIN PROX WIDE 3.9 (STAPLE) ×2 IMPLANT
STRIP CLOSURE SKIN 1/2X4 (GAUZE/BANDAGES/DRESSINGS) IMPLANT
SUT MNCRL AB 4-0 PS2 18 (SUTURE) IMPLANT
SUT SILK 0 (SUTURE) ×1
SUT SILK 0 MO-6 18XCR BRD 8 (SUTURE) ×1 IMPLANT
SUT SILK 0 TIES 10X30 (SUTURE) IMPLANT
SUT SILK 2 0 TIES 10X30 (SUTURE) IMPLANT
SUT VIC AB 2-0 CP2 18 (SUTURE) ×6 IMPLANT
SYR 10ML LL (SYRINGE) IMPLANT
SYR EPIDURAL 5ML GLASS (SYRINGE) ×2 IMPLANT
TOOL LONG TUNNEL (SPINAL CORD STIMULATOR) ×2 IMPLANT
TOWEL GREEN STERILE (TOWEL DISPOSABLE) IMPLANT
TOWEL GREEN STERILE FF (TOWEL DISPOSABLE) ×2 IMPLANT
WATER STERILE IRR 1000ML POUR (IV SOLUTION) IMPLANT
YANKAUER SUCT BULB TIP NO VENT (SUCTIONS) ×2 IMPLANT

## 2017-11-01 NOTE — Discharge Instructions (Signed)
Dr. Zayvien Canning Post-Op Orders ° °• Ice Pack - 20 minutes on (in a pillow case), and 20 minutes off. Wear the ice pack UNDER the binder. °• Follow up in office, they will call you for an appointment in 10 days to 2 weeks. °• Increase activity gradually.   °• No lifting anything heavier than a gallon of milk (10 pounds) until seen in the office. °• Advance diet slowly as tolerated. °• Dressing care:  Keep dressing dry for 3 days, and on Post-op day 4, may shower. °• Call for fever, drainage, and redness. °• No swimming or bathing in a bathtub (do not get into standing water). °•  °

## 2017-11-01 NOTE — Anesthesia Preprocedure Evaluation (Addendum)
Anesthesia Evaluation  Patient identified by MRN, date of birth, ID band Patient awake    Reviewed: Allergy & Precautions, NPO status , Patient's Chart, lab work & pertinent test results  Airway Mallampati: III  TM Distance: >3 FB Neck ROM: Full    Dental  (+) Edentulous Upper, Edentulous Lower, Dental Advisory Given   Pulmonary COPD, former smoker,    breath sounds clear to auscultation       Cardiovascular hypertension, Pt. on medications and Pt. on home beta blockers  Rhythm:Regular Rate:Normal     Neuro/Psych    GI/Hepatic   Endo/Other  diabetes, Type 2, Insulin Dependent  Renal/GU      Musculoskeletal   Abdominal   Peds  Hematology   Anesthesia Other Findings   Reproductive/Obstetrics                            Anesthesia Physical Anesthesia Plan  ASA: III  Anesthesia Plan: General   Post-op Pain Management:    Induction: Intravenous  PONV Risk Score and Plan: Ondansetron and Midazolam  Airway Management Planned: Oral ETT  Additional Equipment:   Intra-op Plan:   Post-operative Plan: Extubation in OR  Informed Consent: I have reviewed the patients History and Physical, chart, labs and discussed the procedure including the risks, benefits and alternatives for the proposed anesthesia with the patient or authorized representative who has indicated his/her understanding and acceptance.   Dental advisory given  Plan Discussed with: CRNA and Anesthesiologist  Anesthesia Plan Comments:        Anesthesia Quick Evaluation

## 2017-11-01 NOTE — Anesthesia Postprocedure Evaluation (Signed)
Anesthesia Post Note  Patient: Erin Carlson  Procedure(s) Performed: LUMBAR SPINAL CORD STIMULATOR INSERTION (N/A )     Patient location during evaluation: PACU Anesthesia Type: General Level of consciousness: awake, awake and alert and oriented Pain management: pain level controlled Vital Signs Assessment: post-procedure vital signs reviewed and stable Respiratory status: spontaneous breathing, nonlabored ventilation and respiratory function stable Cardiovascular status: blood pressure returned to baseline Anesthetic complications: no    Last Vitals:  Vitals:   11/01/17 1245 11/01/17 1300  BP:    Pulse: 91   Resp: 12   Temp:  37 C  SpO2: 94%     Last Pain:  Vitals:   11/01/17 1308  TempSrc:   PainSc: 0-No pain                 Wynn Kernes COKER

## 2017-11-01 NOTE — Transfer of Care (Signed)
Immediate Anesthesia Transfer of Care Note  Patient: Erin Carlson  Procedure(s) Performed: LUMBAR SPINAL CORD STIMULATOR INSERTION (N/A )  Patient Location: PACU  Anesthesia Type:General  Level of Consciousness: awake, oriented and patient cooperative  Airway & Oxygen Therapy: Patient Spontanous Breathing and Patient connected to face mask oxygen  Post-op Assessment: Report given to RN and Post -op Vital signs reviewed and stable  Post vital signs: Reviewed  Last Vitals:  Vitals:   11/01/17 0717 11/01/17 0805  BP: (!) 134/44 (!) 133/50  Pulse: 80   Resp: 20   Temp: 36.5 C   SpO2: 96%     Last Pain:  Vitals:   11/01/17 0814  TempSrc:   PainSc: 7       Patients Stated Pain Goal: 4 (70/11/00 3496)  Complications: No apparent anesthesia complications

## 2017-11-01 NOTE — Anesthesia Procedure Notes (Signed)
Procedure Name: Intubation Date/Time: 11/01/2017 9:40 AM Performed by: Jenne Campus, CRNA Pre-anesthesia Checklist: Patient identified, Emergency Drugs available, Suction available and Patient being monitored Patient Re-evaluated:Patient Re-evaluated prior to induction Oxygen Delivery Method: Circle System Utilized Preoxygenation: Pre-oxygenation with 100% oxygen Induction Type: IV induction Ventilation: Mask ventilation without difficulty Laryngoscope Size: Miller and 2 Grade View: Grade I Tube type: Oral Tube size: 7.0 mm Number of attempts: 1 Airway Equipment and Method: Stylet and Oral airway Placement Confirmation: ETT inserted through vocal cords under direct vision,  positive ETCO2 and breath sounds checked- equal and bilateral Secured at: 21 cm Tube secured with: Tape Dental Injury: Teeth and Oropharynx as per pre-operative assessment

## 2017-11-01 NOTE — Op Note (Signed)
PREOP DX: 1) lumbago  2) lumbar radiculopathy  3) chronic pain  POSTOP DX: same as preop PROCEDURES PERFORMED:1) intraop fluoro 2) placement of 2 16 contact [employer] Infinion leads 3) placement of Spectra SCS generator  SURGEON:Bryn Saline  ASSISTANT: NONE  ANESTHESIA: GETA EBL: <20cc  DESCRIPTION OF PROCEDURE: After a discussion of risks, benefits and alternatives, informed consent was obtained. An adequate plane of anesthesia induced, and the patient turned prone onto a Jackson table, all pressure points padded, SCD's placed.  A timeout was taken to verify the correct patient, position, personnel, availability of appropriate equipment, and administration of perioperative antibiotics.   The thoracic and lumbar areas were widely prepped with chloraprep and draped into a sterile field. Fluoroscopy was used to plan a right paramedian incision at the T12-L2 levels, and an incision made with a 10 blade and carried down to the dorsolumbar fascia with the bovie and blunt dissection. Retractors were placed and a 14g [employer] tuohy needle placed into the epidural space at the T12-L1 interspace using biplanar fluoro and loss-of-resistance technique. The needle was aspirated without any return of fluid. A [employer] INFINION lead was introduced and under live AP fluoro advanced until the distal-most contact overlay the midportion of the T8 vertebral body shadow with the rest of the contacts distributed over the T9 and T10 vertebral bodies in a position just right of anatomic midline. A second Infinion lead was placed just left of anatomic midline in the same levels using the same technique.   0 silk sutures were placed in the fascia adjacent to the needles. The needles and stylets were removed under fluoroscopy with no lead migration noted. Leads were then fixed to the fascia by Clik anchors with the sutures; repeat images were obtained to  verify that there had been no lead migration.  The incision was inspected and hemostasis obtained with the bipolar cautery.  Attention was then turned to creation of a subcutaneous pocket. At the right flank, a 3 cm incision was made with a 10 blade and using the bovie and blunt dissection a pocket of size appropriate to place a SCS generator. The pocket was trialed, and found to be of adequate size. The pocket was inspected for hemostasis, which was found to be excellent. Using reverse seldinger technique, the leads were tunneled to the pocket site, and the leads inserted into the SCS generator. Impedances were checked, and all found to be excellent. The leads were then all fixed into position with a self-torquing wrench. The wiring was all carefully coiled, placed behind the generator and placed in the pocket.  Both incisions were copiously irrigated with bacitracin-containing irrigation. The lumbar incision was closed in 2 deep layers of interrupted 2-0 vicryl and the skin closed with staples. The pocket incision was closed with a deeper layer of 2-0 vicryl interrupted sutures, and the skin closed with staples. Sterile dressings were applied. Needle, sponge, and instrument counts were correct x2 at the end of the case.  The patient was then carefully awakened from anesthesia, turned supine, an abdominal binder placed, and the patient taken to the recovery room where he underwent complex spinal cord stimulator programming.  COMPLICATIONS: NONE  CONDITION: Stable throughout the course of the procedure and immediately afterward  DISPOSITION: discharge to home, with antibiotics and pain medicine. Discussed care with the patient and family member. Followup in clinic will be scheduled in 10-14 days.

## 2017-11-04 ENCOUNTER — Encounter (HOSPITAL_COMMUNITY): Payer: Self-pay | Admitting: Anesthesiology

## 2018-12-24 HISTORY — PX: SKIN SURGERY: SHX2413

## 2020-03-24 HISTORY — PX: EYE SURGERY: SHX253

## 2021-02-21 HISTORY — PX: CARPAL TUNNEL RELEASE: SHX101

## 2021-08-30 DIAGNOSIS — I700 Atherosclerosis of aorta: Secondary | ICD-10-CM | POA: Diagnosis not present

## 2021-09-13 ENCOUNTER — Encounter: Payer: Self-pay | Admitting: *Deleted

## 2021-09-13 ENCOUNTER — Encounter: Payer: Self-pay | Admitting: Cardiology

## 2021-09-14 ENCOUNTER — Ambulatory Visit (INDEPENDENT_AMBULATORY_CARE_PROVIDER_SITE_OTHER): Payer: Medicare Other | Admitting: Cardiology

## 2021-09-14 ENCOUNTER — Other Ambulatory Visit: Payer: Self-pay

## 2021-09-14 ENCOUNTER — Ambulatory Visit (INDEPENDENT_AMBULATORY_CARE_PROVIDER_SITE_OTHER): Payer: Medicare Other

## 2021-09-14 ENCOUNTER — Encounter: Payer: Self-pay | Admitting: Cardiology

## 2021-09-14 VITALS — BP 138/76 | HR 70 | Ht 63.0 in | Wt 208.8 lb

## 2021-09-14 DIAGNOSIS — I10 Essential (primary) hypertension: Secondary | ICD-10-CM | POA: Diagnosis not present

## 2021-09-14 DIAGNOSIS — R5383 Other fatigue: Secondary | ICD-10-CM

## 2021-09-14 DIAGNOSIS — R0609 Other forms of dyspnea: Secondary | ICD-10-CM | POA: Insufficient documentation

## 2021-09-14 DIAGNOSIS — R0600 Dyspnea, unspecified: Secondary | ICD-10-CM

## 2021-09-14 DIAGNOSIS — R002 Palpitations: Secondary | ICD-10-CM

## 2021-09-14 DIAGNOSIS — E669 Obesity, unspecified: Secondary | ICD-10-CM

## 2021-09-14 DIAGNOSIS — R0602 Shortness of breath: Secondary | ICD-10-CM | POA: Insufficient documentation

## 2021-09-14 DIAGNOSIS — R079 Chest pain, unspecified: Secondary | ICD-10-CM

## 2021-09-14 DIAGNOSIS — E785 Hyperlipidemia, unspecified: Secondary | ICD-10-CM | POA: Diagnosis not present

## 2021-09-14 MED ORDER — NITROGLYCERIN 0.4 MG SL SUBL: 0.4000 mg | SUBLINGUAL_TABLET | SUBLINGUAL | 3 refills | Status: DC | PRN

## 2021-09-14 NOTE — Addendum Note (Signed)
Addended by: Orvan July on: 09/14/2021 11:03 AM   Modules accepted: Orders

## 2021-09-14 NOTE — H&P (View-Only) (Signed)
Cardiology Office Note:    Date:  09/14/2021   ID:  Erin Carlson, DOB May 29, 1951, MRN [id number]  PCP:  Imagene Riches, NP  Cardiologist:  None  Electrophysiologist:  None   Referring MD: Imagene Riches, NP   " I am doing well"  History of Present Illness:    Erin Carlson is a 70 y.o. female with a hx of type 2 diabetes, hypertension, hyperlipidemia, morbid obesity is here today to be evaluated for chest pain and shortness of breath.  The patient tells me that it initially started several months ago when she was diagnosed with COVID and then was treated for COVID-19 pneumonia.  During that time she recovered well and shortness of breath resolved.  But recently she has had intermittent shortness of breath on exertion.  She tells me usually it is when she is on exertion.  She notes that at first it was only when she was active she was sit down it gets better.  No she is experiencing left-sided chest discomfort with this.  She described her chest discomfort as a left-sided pressure-like sensation which starts and then radiates to her arm.  It is concerning for her.  It would last for few seconds and then improves.  Nothing makes it better or worse.  In addition to the above she has had intermittent palpitations which she described as an abrupt onset of fast heartbeat which can come off and on abruptly.  It is associated with her shortness of breath but she denies any lightheadedness or dizziness.  No other complaints at this time.  Lab work reviewed from her PCP which was on July 28, 2021 WBC 7.5, hematocrit 43.4, hemoglobin 14.2, platelets 215 Hemoglobin A1c 6.5. Vitamin D 59, sodium 140, potassium 4.4, bicarb 26, glucose 104, calcium 9.4, albumin 4.3, AST 21, ALT 126, anion gap 10 Lipid profile: LDL 88, total cholesterol 162, HDL 35, non-HDL cholesterol 127  Past Medical History:  Diagnosis Date   Abnormal laboratory test 06/20/2016   Allergic rhinitis    Anxiety    Arthritis    Cancer  ([place])    basal cell 2016   COPD (chronic obstructive pulmonary disease) ([place])    Diabetes mellitus without complication ([place])    Dizziness 03/19/2016   Essential hypertension    Fibromyalgia 03/19/2016   GERD without esophagitis    History of kidney stones    10/2016   Hyperlipidemia    Major depressive disorder    Obesity    Old myocardial infarction    Palpitations 03/19/2016   Peripheral vascular disease ([place])    Polyneuropathy    Rosacea    Shortness of breath 03/19/2016   Vitamin D deficiency     Past Surgical History:  Procedure Laterality Date   ABDOMINAL HYSTERECTOMY     1989   APPENDECTOMY     CARPAL TUNNEL RELEASE Left 02/2021   [place]   EYE SURGERY  10/2017   EYE SURGERY Bilateral 03/2020   Implants   NASAL SEPTUM SURGERY     2018    SKIN SURGERY  2013   Basal Cell Left side Nare   SKIN SURGERY  2020   Face   SPINAL CORD STIMULATOR INSERTION N/A 11/01/2017   Procedure: LUMBAR SPINAL CORD STIMULATOR INSERTION;  Surgeon: Clydell Hakim, MD;  Location: [facility];  Service: Neurosurgery;  Laterality: N/A;  LUMBAR SPINAL CORD STIMULATOR INSERTION   TENNIS ELBOW RELEASE/NIRSCHEL PROCEDURE  1988    Current Medications: Current Meds  Medication Sig   acetaminophen (TYLENOL) 650 MG CR tablet Take 1,300 mg by mouth daily as needed for pain.   albuterol (VENTOLIN HFA) 108 (90 Base) MCG/ACT inhaler Inhale 2 puffs into the lungs every 6 (six) hours as needed for wheezing or shortness of breath.    aspirin EC 81 MG tablet Take 81 mg by mouth daily.   BYDUREON BCISE 2 MG/0.85ML AUIJ Inject 2 mg into the skin once a week.   Calcium Carb-Cholecalciferol (CALCIUM 600 + D PO) Take 1 tablet by mouth daily.   cyclobenzaprine (FLEXERIL) 10 MG tablet Take 10 mg by mouth at bedtime as needed for muscle spasms.    FLUoxetine (PROZAC) 20 MG capsule Take 20 mg by mouth daily.   losartan (COZAAR) 50 MG tablet Take 50 mg by mouth daily.    metoprolol tartrate (LOPRESSOR) 25 MG tablet Take 25 mg by mouth 2 (two) times daily.   metroNIDAZOLE (METROGEL) 1 % gel Apply 1 application topically daily.   Multiple Vitamin (MULTIVITAMIN WITH MINERALS) TABS tablet Take 1 tablet by mouth daily.   nitroGLYCERIN (NITROSTAT) 0.4 MG SL tablet Place 1 tablet (0.4 mg total) under the tongue every 5 (five) minutes as needed for chest pain.   Omega-3 Fatty Acids (FISH OIL) 1200 MG CAPS Take 1,200 mg by mouth daily.   omeprazole (PRILOSEC) 40 MG capsule Take 40 mg by mouth daily.   pravastatin (PRAVACHOL) 20 MG tablet Take 20 mg by mouth daily.   pregabalin (LYRICA) 100 MG capsule Take 100 mg by mouth 2 (two) times daily.     Allergies:   Cortisone, Hydrocodone, Oxycodone, Paxil [paroxetine hcl], and Tramadol   Social History   Socioeconomic History   Marital status: Divorced    Spouse name: Not on file   Number of children: Not on file   Years of education: Not on file   Highest education level: Not on file  Occupational History   Not on file  Tobacco Use   Smoking status: Former    Packs/day: 1.50    Years: 20.00    Pack years: 30.00    Types: Cigarettes    Quit date: 2007    Years since quitting: 15.7   Smokeless tobacco: Never  Vaping Use   Vaping Use: Never used  Substance and Sexual Activity   Alcohol use: No   Drug use: No   Sexual activity: Not on file  Other Topics Concern   Not on file  Social History Narrative   Not on file   Social Determinants of Health   Financial Resource Strain: Not on file  Food Insecurity: Not on file  Transportation Needs: Not on file  Physical Activity: Not on file  Stress: Not on file  Social Connections: Not on file     Family History: The patient's family history includes Colon cancer in her brother and father; Diabetes in her maternal grandfather and maternal grandmother; Heart disease in her father; Hypertension in her brother and brother; Stroke in her mother and  sister.  ROS:   Review of Systems  Constitution: Negative for decreased appetite, fever and weight gain.  HENT: Negative for congestion, ear discharge, hoarse voice and sore throat.   Eyes: Negative for discharge, redness, vision loss in right eye and visual halos.  Cardiovascular: Reports chest pain, dyspnea on exertion, and palpitation.  Negative for leg swelling, orthopnea.  Respiratory: Negative for cough, hemoptysis, shortness of breath and snoring.  Endocrine: Negative for heat intolerance and polyphagia.  Hematologic/Lymphatic: Negative for bleeding problem. Does not bruise/bleed easily.  Skin: Negative for flushing, nail changes, rash and suspicious lesions.  Musculoskeletal: Negative for arthritis, joint pain, muscle cramps, myalgias, neck pain and stiffness.  Gastrointestinal: Negative for abdominal pain, bowel incontinence, diarrhea and excessive appetite.  Genitourinary: Negative for decreased libido, genital sores and incomplete emptying.  Neurological: Negative for brief paralysis, focal weakness, headaches and loss of balance.  Psychiatric/Behavioral: Negative for altered mental status, depression and suicidal ideas.  Allergic/Immunologic: Negative for HIV exposure and persistent infections.    EKGs/Labs/Other Studies Reviewed:    The following studies were reviewed today:   EKG:  The ekg ordered today demonstrates sinus rhythm, heart rate 70 bpm.  ABI bilateral lower extremities resting ABI of bilateral lower extremity within the normal limits with segment dual exam demonstrating no significant arterial occlusive disease.  Echocardiogram done on August 30, 2021 EF 55 to 60% no regional wall abnormalities.  Right ventricle normal in size and function.  Left atrium normal in size.  Right atrium is normal in size and function.  Interatrial and interventricular septum is intact.  Normal mitral valve with no stenosis or regurgitation.  Tricuspid valve is normal no  regurgitation or stenosis.  Pulmonic valve appears to be normal.  Recent Labs: No results found for requested labs within last 8760 hours.  Recent Lipid Panel No results found for: CHOL, TRIG, HDL, CHOLHDL, VLDL, LDLCALC, LDLDIRECT  Physical Exam:    VS:  BP 138/76   Pulse 70   Ht 5\' 3"  (1.6 m)   Wt 208 lb 12.8 oz (94.7 kg)   SpO2 95%   BMI 36.99 kg/m     Wt Readings from Last 3 Encounters:  09/14/21 208 lb 12.8 oz (94.7 kg)  08/18/21 208 lb (94.3 kg)  10/24/17 220 lb 7 oz (100 kg)     GEN: Well nourished, well developed in no acute distress HEENT: Normal NECK: No JVD; No carotid bruits LYMPHATICS: No lymphadenopathy CARDIAC: S1S2 noted,RRR, no murmurs, rubs, gallops RESPIRATORY:  Clear to auscultation without rales, wheezing or rhonchi  ABDOMEN: Soft, non-tender, non-distended, +bowel sounds, no guarding. EXTREMITIES: No edema, No cyanosis, no clubbing MUSCULOSKELETAL:  No deformity  SKIN: Warm and dry NEUROLOGIC:  Alert and oriented x 3, non-focal PSYCHIATRIC:  Normal affect, good insight  ASSESSMENT:    1. DOE (dyspnea on exertion)   2. SOB (shortness of breath)   3. Palpitations   4. Hyperlipidemia, unspecified hyperlipidemia type   5. Hypertension, unspecified type   6. Chest pain of uncertain etiology   7. Obesity (BMI 30-39.9)   8. Other fatigue    PLAN:     The symptoms chest pain is concerning, this patient does have intermittent too high risk for coronary artery disease.  For this it would be best to move forward with left heart catheterization in this patient.  The patient understands that risks include but are not limited to stroke (1 in 1000), death (1 in 35), kidney failure [usually temporary] (1 in 500), bleeding (1 in 200), allergic reaction [possibly serious] (1 in 200), and agrees to proceed.    Sublingual nitroglycerin prescription was sent, its protocol and 911 protocol explained and the patient vocalized understanding questions were answered  to the patient's satisfaction   I reviewed her echocardiogram which was done at [facility] on August 29, 2021 this is essentially a normal study.    I would like to rule out a  cardiovascular etiology of this palpitation, therefore at this time I would like to placed a zio patch for  3-7  days.  I do suspect the patient may have sleep apnea have talked to her about this it may be the cause of her fatigue but she tells me that she has talked to her PCP about sleep study and they are planning to do so.  The patient understands the need to lose weight with diet and exercise. We have discussed specific strategies for this.  We will get blood work today for [employer] as well as CBC.  The patient is in agreement with the above plan. The patient left the office in stable condition.  The patient will follow up in 4 weeks with Dr. Agustin Cree.   Medication Adjustments/Labs and Tests Ordered: Current medicines are reviewed at length with the patient today.  Concerns regarding medicines are outlined above.  Orders Placed This Encounter  Procedures   Basic Metabolic Panel (BMET)   Magnesium   CBC with Differential/Platelet   LONG TERM MONITOR (3-14 DAYS)   EKG 12-Lead   Meds ordered this encounter  Medications   nitroGLYCERIN (NITROSTAT) 0.4 MG SL tablet    Sig: Place 1 tablet (0.4 mg total) under the tongue every 5 (five) minutes as needed for chest pain.    Dispense:  90 tablet    Refill:  3    Patient Instructions  Medication Instructions:  Your physician has recommended you make the following change in your medication:  START: Nitroglycerin 0.4 mg take one tablet by mouth every 5 minutes up to three times as needed for chest pain.  *If you need a refill on your cardiac medications before your next appointment, please call your pharmacy*   Lab Work: Your physician recommends that you return for lab work in: TODAY: BMET, Rew, CBC  If you have labs (blood work) drawn today and  your tests are completely normal, you will receive your results only by: MyChart Message (if you have MyChart) OR A paper copy in the mail If you have any lab test that is abnormal or we need to change your treatment, we will call you to review the results.   Testing/Procedures: A zio monitor was ordered today. It will remain on for 7 days. You will then return monitor and event diary in provided box. It takes 1-2 weeks for report to be downloaded and returned to [place]. We will call you with the results. If monitor falls off or has orange flashing light, please call Zio for further instructions.   ZIO  WHY IS MY DOCTOR PRESCRIBING ZIO? The Zio system is proven and trusted by physicians to detect and diagnose irregular heart rhythms -- and has been prescribed to hundreds of thousands of patients.  The FDA has cleared the Zio system to monitor for many different kinds of irregular heart rhythms. In a study, physicians were able to reach a diagnosis 90% of the time with the Zio system1.  You can wear the Zio monitor -- a small, discreet, comfortable patch -- during your normal day-to-day activity, including while you sleep, shower, and exercise, while it records every single heartbeat for analysis.  1Barrett, P., et al. Comparison of 24 Hour Holter Monitoring Versus 14 Day Novel Adhesive Patch Electrocardiographic Monitoring. [place], 2014.  ZIO VS. HOLTER MONITORING The Zio monitor can be comfortably worn for up to 14 days. Holter monitors can be worn for 24 to 48 hours, limiting the time  to record any irregular heart rhythms you may have. Zio is able to capture data for the 51% of patients who have their first symptom-triggered arrhythmia after 48 hours.1  LIVE WITHOUT RESTRICTIONS The Zio ambulatory cardiac monitor is a small, unobtrusive, and water-resistant patch--you might even forget you're wearing it. The Zio monitor records and stores every beat of your heart, whether  you're sleeping, working out, or showering. Remove on: Sept 29th 2022   [place] [address] Dept: [phone] Loc: [place]  09/14/2021  You are scheduled for a Cardiac Catheterization on Monday, September 26 with Dr. Glenetta Hew.  1. Please arrive at the [facility] (Main Entrance A) at [facility]: [address] [place],  [address] at 5:30 AM (This time is two hours before your procedure to ensure your preparation). Free valet parking service is available.   Special note: Every effort is made to have your procedure done on time. Please understand that emergencies sometimes delay scheduled procedures.  2. Diet: Do not eat solid foods after midnight.  The patient may have clear liquids until 5am upon the day of the procedure.  3. Labs: You will need to have blood drawn on TODAY.  4. Medication instructions in preparation for your procedure:   Contrast Allergy: No On the morning of your procedure, take your Aspirin and any morning medicines NOT listed above.  You may use sips of water.  5. Plan for one night stay--bring personal belongings. 6. Bring a current list of your medications and current insurance cards. 7. You MUST have a responsible person to drive you home. 8. Someone MUST be with you the first 24 hours after you arrive home or your discharge will be delayed. 9. Please wear clothes that are easy to get on and off and wear slip-on shoes.  Thank you for allowing [place] to care for you!   -- [facility]    Follow-Up: At [facility], you and your health needs are our priority.  As part of our continuing mission to provide you with exceptional heart care, we have created designated Provider Care Teams.  These Care Teams include your primary Cardiologist (physician) and Advanced Practice Providers  (APPs -  Physician Assistants and Nurse Practitioners) who all work together to provide you with the care you need, when you need it.  We recommend signing up for the patient portal called "MyChart".  Sign up information is provided on this After Visit Summary.  MyChart is used to connect with patients for Virtual Visits (Telemedicine).  Patients are able to view lab/test results, encounter notes, upcoming appointments, etc.  Non-urgent messages can be sent to your provider as well.   To learn more about what you can do with MyChart, go to [email or web address].    Your next appointment:   4 week(s)  The format for your next appointment:   In Person  Provider:   Jenne Campus, MD   Other Instructions     Adopting a Healthy Lifestyle.  Know what a healthy weight is for you (roughly BMI <25) and aim to maintain this   Aim for 7+ servings of fruits and vegetables daily   65-80+ fluid ounces of water or unsweet tea for healthy kidneys   Limit to max 1 drink of alcohol per day; avoid smoking/tobacco   Limit animal fats in diet for cholesterol and heart health -  choose grass fed whenever available   Avoid highly processed foods, and foods high in saturated/trans fats   Aim for low stress - take time to unwind and care for your mental health   Aim for 150 min of moderate intensity exercise weekly for heart health, and weights twice weekly for bone health   Aim for 7-9 hours of sleep daily   When it comes to diets, agreement about the perfect plan isnt easy to find, even among the experts. Experts at the [employer] developed an idea known as the Healthy Eating Plate. Just imagine a plate divided into logical, healthy portions.   The emphasis is on diet quality:   Load up on vegetables and fruits - one-half of your plate: Aim for color and variety, and remember that potatoes dont count.   Go for whole grains - one-quarter of your plate: Whole wheat,  barley, wheat berries, quinoa, oats, brown rice, and foods made with them. If you want pasta, go with whole wheat pasta.   Protein power - one-quarter of your plate: Fish, chicken, beans, and nuts are all healthy, versatile protein sources. Limit red meat.   The diet, however, does go beyond the plate, offering a few other suggestions.   Use healthy plant oils, such as olive, canola, soy, corn, sunflower and peanut. Check the labels, and avoid partially hydrogenated oil, which have unhealthy trans fats.   If youre thirsty, drink water. Coffee and tea are good in moderation, but skip sugary drinks and limit milk and dairy products to one or two daily servings.   The type of carbohydrate in the diet is more important than the amount. Some sources of carbohydrates, such as vegetables, fruits, whole grains, and beans-are healthier than others.   Finally, stay active  Signed, Berniece Salines, DO  09/14/2021 10:54 AM    [facility]

## 2021-09-14 NOTE — Progress Notes (Signed)
Cardiology Office Note:    Date:  09/14/2021   ID:  Erin Carlson, DOB 05/03/1951, MRN [id number]  PCP:  Imagene Riches, NP  Cardiologist:  None  Electrophysiologist:  None   Referring MD: Imagene Riches, NP   " I am doing well"  History of Present Illness:    Erin Carlson is a 70 y.o. female with a hx of type 2 diabetes, hypertension, hyperlipidemia, morbid obesity is here today to be evaluated for chest pain and shortness of breath.  The patient tells me that it initially started several months ago when she was diagnosed with COVID and then was treated for COVID-19 pneumonia.  During that time she recovered well and shortness of breath resolved.  But recently she has had intermittent shortness of breath on exertion.  She tells me usually it is when she is on exertion.  She notes that at first it was only when she was active she was sit down it gets better.  No she is experiencing left-sided chest discomfort with this.  She described her chest discomfort as a left-sided pressure-like sensation which starts and then radiates to her arm.  It is concerning for her.  It would last for few seconds and then improves.  Nothing makes it better or worse.  In addition to the above she has had intermittent palpitations which she described as an abrupt onset of fast heartbeat which can come off and on abruptly.  It is associated with her shortness of breath but she denies any lightheadedness or dizziness.  No other complaints at this time.  Lab work reviewed from her PCP which was on July 28, 2021 WBC 7.5, hematocrit 43.4, hemoglobin 14.2, platelets 215 Hemoglobin A1c 6.5. Vitamin D 59, sodium 140, potassium 4.4, bicarb 26, glucose 104, calcium 9.4, albumin 4.3, AST 21, ALT 126, anion gap 10 Lipid profile: LDL 88, total cholesterol 162, HDL 35, non-HDL cholesterol 127  Past Medical History:  Diagnosis Date   Abnormal laboratory test 06/20/2016   Allergic rhinitis    Anxiety    Arthritis    Cancer  ([place])    basal cell 2016   COPD (chronic obstructive pulmonary disease) ([place])    Diabetes mellitus without complication ([place])    Dizziness 03/19/2016   Essential hypertension    Fibromyalgia 03/19/2016   GERD without esophagitis    History of kidney stones    10/2016   Hyperlipidemia    Major depressive disorder    Obesity    Old myocardial infarction    Palpitations 03/19/2016   Peripheral vascular disease ([place])    Polyneuropathy    Rosacea    Shortness of breath 03/19/2016   Vitamin D deficiency     Past Surgical History:  Procedure Laterality Date   ABDOMINAL HYSTERECTOMY     1989   APPENDECTOMY     CARPAL TUNNEL RELEASE Left 02/2021   [place]   EYE SURGERY  10/2017   EYE SURGERY Bilateral 03/2020   Implants   NASAL SEPTUM SURGERY     2018    SKIN SURGERY  2013   Basal Cell Left side Nare   SKIN SURGERY  2020   Face   SPINAL CORD STIMULATOR INSERTION N/A 11/01/2017   Procedure: LUMBAR SPINAL CORD STIMULATOR INSERTION;  Surgeon: Clydell Hakim, MD;  Location: [facility];  Service: Neurosurgery;  Laterality: N/A;  LUMBAR SPINAL CORD STIMULATOR INSERTION   TENNIS ELBOW RELEASE/NIRSCHEL PROCEDURE  1988    Current Medications: Current Meds  Medication Sig   acetaminophen (TYLENOL) 650 MG CR tablet Take 1,300 mg by mouth daily as needed for pain.   albuterol (VENTOLIN HFA) 108 (90 Base) MCG/ACT inhaler Inhale 2 puffs into the lungs every 6 (six) hours as needed for wheezing or shortness of breath.    aspirin EC 81 MG tablet Take 81 mg by mouth daily.   BYDUREON BCISE 2 MG/0.85ML AUIJ Inject 2 mg into the skin once a week.   Calcium Carb-Cholecalciferol (CALCIUM 600 + D PO) Take 1 tablet by mouth daily.   cyclobenzaprine (FLEXERIL) 10 MG tablet Take 10 mg by mouth at bedtime as needed for muscle spasms.    FLUoxetine (PROZAC) 20 MG capsule Take 20 mg by mouth daily.   losartan (COZAAR) 50 MG tablet Take 50 mg by mouth daily.    metoprolol tartrate (LOPRESSOR) 25 MG tablet Take 25 mg by mouth 2 (two) times daily.   metroNIDAZOLE (METROGEL) 1 % gel Apply 1 application topically daily.   Multiple Vitamin (MULTIVITAMIN WITH MINERALS) TABS tablet Take 1 tablet by mouth daily.   nitroGLYCERIN (NITROSTAT) 0.4 MG SL tablet Place 1 tablet (0.4 mg total) under the tongue every 5 (five) minutes as needed for chest pain.   Omega-3 Fatty Acids (FISH OIL) 1200 MG CAPS Take 1,200 mg by mouth daily.   omeprazole (PRILOSEC) 40 MG capsule Take 40 mg by mouth daily.   pravastatin (PRAVACHOL) 20 MG tablet Take 20 mg by mouth daily.   pregabalin (LYRICA) 100 MG capsule Take 100 mg by mouth 2 (two) times daily.     Allergies:   Cortisone, Hydrocodone, Oxycodone, Paxil [paroxetine hcl], and Tramadol   Social History   Socioeconomic History   Marital status: Divorced    Spouse name: Not on file   Number of children: Not on file   Years of education: Not on file   Highest education level: Not on file  Occupational History   Not on file  Tobacco Use   Smoking status: Former    Packs/day: 1.50    Years: 20.00    Pack years: 30.00    Types: Cigarettes    Quit date: 2007    Years since quitting: 15.7   Smokeless tobacco: Never  Vaping Use   Vaping Use: Never used  Substance and Sexual Activity   Alcohol use: No   Drug use: No   Sexual activity: Not on file  Other Topics Concern   Not on file  Social History Narrative   Not on file   Social Determinants of Health   Financial Resource Strain: Not on file  Food Insecurity: Not on file  Transportation Needs: Not on file  Physical Activity: Not on file  Stress: Not on file  Social Connections: Not on file     Family History: The patient's family history includes Colon cancer in her brother and father; Diabetes in her maternal grandfather and maternal grandmother; Heart disease in her father; Hypertension in her brother and brother; Stroke in her mother and  sister.  ROS:   Review of Systems  Constitution: Negative for decreased appetite, fever and weight gain.  HENT: Negative for congestion, ear discharge, hoarse voice and sore throat.   Eyes: Negative for discharge, redness, vision loss in right eye and visual halos.  Cardiovascular: Reports chest pain, dyspnea on exertion, and palpitation.  Negative for leg swelling, orthopnea.  Respiratory: Negative for cough, hemoptysis, shortness of breath and snoring.  Endocrine: Negative for heat intolerance and polyphagia.  Hematologic/Lymphatic: Negative for bleeding problem. Does not bruise/bleed easily.  Skin: Negative for flushing, nail changes, rash and suspicious lesions.  Musculoskeletal: Negative for arthritis, joint pain, muscle cramps, myalgias, neck pain and stiffness.  Gastrointestinal: Negative for abdominal pain, bowel incontinence, diarrhea and excessive appetite.  Genitourinary: Negative for decreased libido, genital sores and incomplete emptying.  Neurological: Negative for brief paralysis, focal weakness, headaches and loss of balance.  Psychiatric/Behavioral: Negative for altered mental status, depression and suicidal ideas.  Allergic/Immunologic: Negative for HIV exposure and persistent infections.    EKGs/Labs/Other Studies Reviewed:    The following studies were reviewed today:   EKG:  The ekg ordered today demonstrates sinus rhythm, heart rate 70 bpm.  ABI bilateral lower extremities resting ABI of bilateral lower extremity within the normal limits with segment dual exam demonstrating no significant arterial occlusive disease.  Echocardiogram done on August 30, 2021 EF 55 to 60% no regional wall abnormalities.  Right ventricle normal in size and function.  Left atrium normal in size.  Right atrium is normal in size and function.  Interatrial and interventricular septum is intact.  Normal mitral valve with no stenosis or regurgitation.  Tricuspid valve is normal no  regurgitation or stenosis.  Pulmonic valve appears to be normal.  Recent Labs: No results found for requested labs within last 8760 hours.  Recent Lipid Panel No results found for: CHOL, TRIG, HDL, CHOLHDL, VLDL, LDLCALC, LDLDIRECT  Physical Exam:    VS:  BP 138/76   Pulse 70   Ht 5\' 3"  (1.6 m)   Wt 208 lb 12.8 oz (94.7 kg)   SpO2 95%   BMI 36.99 kg/m     Wt Readings from Last 3 Encounters:  09/14/21 208 lb 12.8 oz (94.7 kg)  08/18/21 208 lb (94.3 kg)  10/24/17 220 lb 7 oz (100 kg)     GEN: Well nourished, well developed in no acute distress HEENT: Normal NECK: No JVD; No carotid bruits LYMPHATICS: No lymphadenopathy CARDIAC: S1S2 noted,RRR, no murmurs, rubs, gallops RESPIRATORY:  Clear to auscultation without rales, wheezing or rhonchi  ABDOMEN: Soft, non-tender, non-distended, +bowel sounds, no guarding. EXTREMITIES: No edema, No cyanosis, no clubbing MUSCULOSKELETAL:  No deformity  SKIN: Warm and dry NEUROLOGIC:  Alert and oriented x 3, non-focal PSYCHIATRIC:  Normal affect, good insight  ASSESSMENT:    1. DOE (dyspnea on exertion)   2. SOB (shortness of breath)   3. Palpitations   4. Hyperlipidemia, unspecified hyperlipidemia type   5. Hypertension, unspecified type   6. Chest pain of uncertain etiology   7. Obesity (BMI 30-39.9)   8. Other fatigue    PLAN:     The symptoms chest pain is concerning, this patient does have intermittent too high risk for coronary artery disease.  For this it would be best to move forward with left heart catheterization in this patient.  The patient understands that risks include but are not limited to stroke (1 in 1000), death (1 in 37), kidney failure [usually temporary] (1 in 500), bleeding (1 in 200), allergic reaction [possibly serious] (1 in 200), and agrees to proceed.    Sublingual nitroglycerin prescription was sent, its protocol and 911 protocol explained and the patient vocalized understanding questions were answered  to the patient's satisfaction   I reviewed her echocardiogram which was done at [facility] on August 29, 2021 this is essentially a normal study.    I would like to rule out a  cardiovascular etiology of this palpitation, therefore at this time I would like to placed a zio patch for  3-7  days.  I do suspect the patient may have sleep apnea have talked to her about this it may be the cause of her fatigue but she tells me that she has talked to her PCP about sleep study and they are planning to do so.  The patient understands the need to lose weight with diet and exercise. We have discussed specific strategies for this.  We will get blood work today for [employer] as well as CBC.  The patient is in agreement with the above plan. The patient left the office in stable condition.  The patient will follow up in 4 weeks with Dr. Agustin Cree.   Medication Adjustments/Labs and Tests Ordered: Current medicines are reviewed at length with the patient today.  Concerns regarding medicines are outlined above.  Orders Placed This Encounter  Procedures   Basic Metabolic Panel (BMET)   Magnesium   CBC with Differential/Platelet   LONG TERM MONITOR (3-14 DAYS)   EKG 12-Lead   Meds ordered this encounter  Medications   nitroGLYCERIN (NITROSTAT) 0.4 MG SL tablet    Sig: Place 1 tablet (0.4 mg total) under the tongue every 5 (five) minutes as needed for chest pain.    Dispense:  90 tablet    Refill:  3    Patient Instructions  Medication Instructions:  Your physician has recommended you make the following change in your medication:  START: Nitroglycerin 0.4 mg take one tablet by mouth every 5 minutes up to three times as needed for chest pain.  *If you need a refill on your cardiac medications before your next appointment, please call your pharmacy*   Lab Work: Your physician recommends that you return for lab work in: TODAY: BMET, [place], CBC  If you have labs (blood work) drawn today and  your tests are completely normal, you will receive your results only by: MyChart Message (if you have MyChart) OR A paper copy in the mail If you have any lab test that is abnormal or we need to change your treatment, we will call you to review the results.   Testing/Procedures: A zio monitor was ordered today. It will remain on for 7 days. You will then return monitor and event diary in provided box. It takes 1-2 weeks for report to be downloaded and returned to [place]. We will call you with the results. If monitor falls off or has orange flashing light, please call Zio for further instructions.   ZIO  WHY IS MY DOCTOR PRESCRIBING ZIO? The Zio system is proven and trusted by physicians to detect and diagnose irregular heart rhythms -- and has been prescribed to hundreds of thousands of patients.  The FDA has cleared the Zio system to monitor for many different kinds of irregular heart rhythms. In a study, physicians were able to reach a diagnosis 90% of the time with the Zio system1.  You can wear the Zio monitor -- a small, discreet, comfortable patch -- during your normal day-to-day activity, including while you sleep, shower, and exercise, while it records every single heartbeat for analysis.  1Barrett, P., et al. Comparison of 24 Hour Holter Monitoring Versus 14 Day Novel Adhesive Patch Electrocardiographic Monitoring. [place], 2014.  ZIO VS. HOLTER MONITORING The Zio monitor can be comfortably worn for up to 14 days. Holter monitors can be worn for 24 to 48 hours, limiting the time  to record any irregular heart rhythms you may have. Zio is able to capture data for the 51% of patients who have their first symptom-triggered arrhythmia after 48 hours.1  LIVE WITHOUT RESTRICTIONS The Zio ambulatory cardiac monitor is a small, unobtrusive, and water-resistant patch--you might even forget you're wearing it. The Zio monitor records and stores every beat of your heart, whether  you're sleeping, working out, or showering. Remove on: Sept 29th 2022   [place] [address] Dept: [phone] Loc: [facility]  09/14/2021  You are scheduled for a Cardiac Catheterization on Monday, September 26 with Dr. Glenetta Hew.  1. Please arrive at the [facility] (Main Entrance A) at [facility]: [address] [place], [place] [address] at 5:30 AM (This time is two hours before your procedure to ensure your preparation). Free valet parking service is available.   Special note: Every effort is made to have your procedure done on time. Please understand that emergencies sometimes delay scheduled procedures.  2. Diet: Do not eat solid foods after midnight.  The patient may have clear liquids until 5am upon the day of the procedure.  3. Labs: You will need to have blood drawn on TODAY.  4. Medication instructions in preparation for your procedure:   Contrast Allergy: No On the morning of your procedure, take your Aspirin and any morning medicines NOT listed above.  You may use sips of water.  5. Plan for one night stay--bring personal belongings. 6. Bring a current list of your medications and current insurance cards. 7. You MUST have a responsible person to drive you home. 8. Someone MUST be with you the first 24 hours after you arrive home or your discharge will be delayed. 9. Please wear clothes that are easy to get on and off and wear slip-on shoes.  Thank you for allowing [place] to care for you!   -- [facility]    Follow-Up: At [facility], you and your health needs are our priority.  As part of our continuing mission to provide you with exceptional heart care, we have created designated Provider Care Teams.  These Care Teams include your primary Cardiologist (physician) and Advanced Practice Providers  (APPs -  Physician Assistants and Nurse Practitioners) who all work together to provide you with the care you need, when you need it.  We recommend signing up for the patient portal called "MyChart".  Sign up information is provided on this After Visit Summary.  MyChart is used to connect with patients for Virtual Visits (Telemedicine).  Patients are able to view lab/test results, encounter notes, upcoming appointments, etc.  Non-urgent messages can be sent to your provider as well.   To learn more about what you can do with MyChart, go to [email or web address].    Your next appointment:   4 week(s)  The format for your next appointment:   In Person  Provider:   Jenne Campus, MD   Other Instructions     Adopting a Healthy Lifestyle.  Know what a healthy weight is for you (roughly BMI <25) and aim to maintain this   Aim for 7+ servings of fruits and vegetables daily   65-80+ fluid ounces of water or unsweet tea for healthy kidneys   Limit to max 1 drink of alcohol per day; avoid smoking/tobacco   Limit animal fats in diet for cholesterol and heart health -  choose grass fed whenever available   Avoid highly processed foods, and foods high in saturated/trans fats   Aim for low stress - take time to unwind and care for your mental health   Aim for 150 min of moderate intensity exercise weekly for heart health, and weights twice weekly for bone health   Aim for 7-9 hours of sleep daily   When it comes to diets, agreement about the perfect plan isnt easy to find, even among the experts. Experts at the [place] developed an idea known as the Healthy Eating Plate. Just imagine a plate divided into logical, healthy portions.   The emphasis is on diet quality:   Load up on vegetables and fruits - one-half of your plate: Aim for color and variety, and remember that potatoes dont count.   Go for whole grains - one-quarter of your plate: Whole wheat,  barley, wheat berries, quinoa, oats, brown rice, and foods made with them. If you want pasta, go with whole wheat pasta.   Protein power - one-quarter of your plate: Fish, chicken, beans, and nuts are all healthy, versatile protein sources. Limit red meat.   The diet, however, does go beyond the plate, offering a few other suggestions.   Use healthy plant oils, such as olive, canola, soy, corn, sunflower and peanut. Check the labels, and avoid partially hydrogenated oil, which have unhealthy trans fats.   If youre thirsty, drink water. Coffee and tea are good in moderation, but skip sugary drinks and limit milk and dairy products to one or two daily servings.   The type of carbohydrate in the diet is more important than the amount. Some sources of carbohydrates, such as vegetables, fruits, whole grains, and beans-are healthier than others.   Finally, stay active  Signed, Berniece Salines, DO  09/14/2021 10:54 AM    [facility]

## 2021-09-14 NOTE — Patient Instructions (Signed)
Medication Instructions:  Your physician has recommended you make the following change in your medication:  START: Nitroglycerin 0.4 mg take one tablet by mouth every 5 minutes up to three times as needed for chest pain.  *If you need a refill on your cardiac medications before your next appointment, please call your pharmacy*   Lab Work: Your physician recommends that you return for lab work in: TODAY: BMET, [place], CBC  If you have labs (blood work) drawn today and your tests are completely normal, you will receive your results only by: MyChart Message (if you have MyChart) OR A paper copy in the mail If you have any lab test that is abnormal or we need to change your treatment, we will call you to review the results.   Testing/Procedures: A zio monitor was ordered today. It will remain on for 7 days. You will then return monitor and event diary in provided box. It takes 1-2 weeks for report to be downloaded and returned to [place]. We will call you with the results. If monitor falls off or has orange flashing light, please call Zio for further instructions.   ZIO  WHY IS MY DOCTOR PRESCRIBING ZIO? The Zio system is proven and trusted by physicians to detect and diagnose irregular heart rhythms -- and has been prescribed to hundreds of thousands of patients.  The FDA has cleared the Zio system to monitor for many different kinds of irregular heart rhythms. In a study, physicians were able to reach a diagnosis 90% of the time with the Zio system1.  You can wear the Zio monitor -- a small, discreet, comfortable patch -- during your normal day-to-day activity, including while you sleep, shower, and exercise, while it records every single heartbeat for analysis.  1Barrett, P., et al. Comparison of 24 Hour Holter Monitoring Versus 14 Day Novel Adhesive Patch Electrocardiographic Monitoring. [place], 2014.  ZIO VS. HOLTER MONITORING The Zio monitor can be comfortably worn for  up to 14 days. Holter monitors can be worn for 24 to 48 hours, limiting the time to record any irregular heart rhythms you may have. Zio is able to capture data for the 51% of patients who have their first symptom-triggered arrhythmia after 48 hours.1  LIVE WITHOUT RESTRICTIONS The Zio ambulatory cardiac monitor is a small, unobtrusive, and water-resistant patch--you might even forget you're wearing it. The Zio monitor records and stores every beat of your heart, whether you're sleeping, working out, or showering. Remove on: Sept 29th 2022   Erin Carlson [id number] Dept: [phone] Loc: [place]  09/14/2021  You are scheduled for a Cardiac Catheterization on Monday, September 26 with Dr. Glenetta Hew.  1. Please arrive at the [facility] (Main Entrance A) at [facility]: [address] [place], [place] [address] at 5:30 AM (This time is two hours before your procedure to ensure your preparation). Free valet parking service is available.   Special note: Every effort is made to have your procedure done on time. Please understand that emergencies sometimes delay scheduled procedures.  2. Diet: Do not eat solid foods after midnight.  The patient may have clear liquids until 5am upon the day of the procedure.  3. Labs: You will need to have blood drawn on TODAY.  4. Medication instructions in preparation for your procedure:   Contrast Allergy: No On the morning of your procedure, take your  Aspirin and any morning medicines NOT listed above.  You may use sips of water.  5. Plan for one night stay--bring personal belongings. 6. Bring a current list of your medications and current insurance cards. 7. You MUST have a responsible person to drive you home. 8. Someone MUST be with you the first 24 hours after you arrive home or your discharge will be delayed. 9.  Please wear clothes that are easy to get on and off and wear slip-on shoes.  Thank you for allowing [place] to care for you!   -- [place] [facility]    Follow-Up: At [facility], you and your health needs are our priority.  As part of our continuing mission to provide you with exceptional heart care, we have created designated Provider Care Teams.  These Care Teams include your primary Cardiologist (physician) and Advanced Practice Providers (APPs -  Physician Assistants and Nurse Practitioners) who all work together to provide you with the care you need, when you need it.  We recommend signing up for the patient portal called "MyChart".  Sign up information is provided on this After Visit Summary.  MyChart is used to connect with patients for Virtual Visits (Telemedicine).  Patients are able to view lab/test results, encounter notes, upcoming appointments, etc.  Non-urgent messages can be sent to your provider as well.   To learn more about what you can do with MyChart, go to [email or web address].    Your next appointment:   4 week(s)  The format for your next appointment:   In Person  Provider:   Jenne Campus, MD   Other Instructions

## 2021-09-15 LAB — BASIC METABOLIC PANEL
BUN/Creatinine Ratio: 16 (ref 12–28)
BUN: 13 mg/dL (ref 8–27)
CO2: 25 mmol/L (ref 20–29)
Calcium: 9.6 mg/dL (ref 8.7–10.3)
Chloride: 104 mmol/L (ref 96–106)
Creatinine, Ser: 0.8 mg/dL (ref 0.57–1.00)
Glucose: 108 mg/dL — ABNORMAL HIGH (ref 65–99)
Potassium: 4.6 mmol/L (ref 3.5–5.2)
Sodium: 143 mmol/L (ref 134–144)
eGFR: 78 mL/min/{1.73_m2} (ref >59)

## 2021-09-15 LAB — CBC WITH DIFFERENTIAL/PLATELET
Basophils Absolute: 0 10*3/uL (ref 0.0–0.2)
Basos: 1 %
EOS (ABSOLUTE): 0.2 10*3/uL (ref 0.0–0.4)
Eos: 3 %
Hematocrit: 42.7 % (ref 34.0–46.6)
Hemoglobin: 14.2 g/dL (ref 11.1–15.9)
Immature Grans (Abs): 0 10*3/uL (ref 0.0–0.1)
Immature Granulocytes: 0 %
Lymphocytes Absolute: 2.2 10*3/uL (ref 0.7–3.1)
Lymphs: 33 %
MCH: 29 pg (ref 26.6–33.0)
MCHC: 33.3 g/dL (ref 31.5–35.7)
MCV: 87 fL (ref 79–97)
Monocytes Absolute: 0.5 10*3/uL (ref 0.1–0.9)
Monocytes: 8 %
Neutrophils Absolute: 3.7 10*3/uL (ref 1.4–7.0)
Neutrophils: 55 %
Platelets: 205 10*3/uL (ref 150–450)
RBC: 4.9 x10E6/uL (ref 3.77–5.28)
RDW: 14.1 % (ref 11.7–15.4)
WBC: 6.7 10*3/uL (ref 3.4–10.8)

## 2021-09-15 LAB — MAGNESIUM: Magnesium: 2 mg/dL (ref 1.6–2.3)

## 2021-09-18 ENCOUNTER — Other Ambulatory Visit: Payer: Self-pay

## 2021-09-18 ENCOUNTER — Ambulatory Visit (HOSPITAL_COMMUNITY)
Admission: RE | Disposition: A | Discharge status: Home / Self Care | Payer: Self-pay | Source: Home / Self Care | Attending: Cardiology

## 2021-09-18 ENCOUNTER — Encounter (HOSPITAL_COMMUNITY): Payer: Self-pay | Admitting: Internal Medicine

## 2021-09-18 ENCOUNTER — Ambulatory Visit (HOSPITAL_COMMUNITY)
Admission: RE | Admit: 2021-09-18 | Discharge: 2021-09-18 | Disposition: A | Discharge status: Home / Self Care | Payer: Medicare Other | Attending: Cardiology | Admitting: Cardiology

## 2021-09-18 DIAGNOSIS — Z833 Family history of diabetes mellitus: Secondary | ICD-10-CM | POA: Insufficient documentation

## 2021-09-18 DIAGNOSIS — Z8616 Personal history of COVID-19: Secondary | ICD-10-CM | POA: Diagnosis not present

## 2021-09-18 DIAGNOSIS — Z79899 Other long term (current) drug therapy: Secondary | ICD-10-CM | POA: Insufficient documentation

## 2021-09-18 DIAGNOSIS — R0609 Other forms of dyspnea: Secondary | ICD-10-CM | POA: Diagnosis present

## 2021-09-18 DIAGNOSIS — Z7982 Long term (current) use of aspirin: Secondary | ICD-10-CM | POA: Diagnosis not present

## 2021-09-18 DIAGNOSIS — I2510 Atherosclerotic heart disease of native coronary artery without angina pectoris: Secondary | ICD-10-CM

## 2021-09-18 DIAGNOSIS — E1151 Type 2 diabetes mellitus with diabetic peripheral angiopathy without gangrene: Secondary | ICD-10-CM | POA: Diagnosis not present

## 2021-09-18 DIAGNOSIS — Z885 Allergy status to narcotic agent status: Secondary | ICD-10-CM | POA: Insufficient documentation

## 2021-09-18 DIAGNOSIS — R079 Chest pain, unspecified: Secondary | ICD-10-CM | POA: Insufficient documentation

## 2021-09-18 DIAGNOSIS — Z8249 Family history of ischemic heart disease and other diseases of the circulatory system: Secondary | ICD-10-CM | POA: Insufficient documentation

## 2021-09-18 DIAGNOSIS — R0600 Dyspnea, unspecified: Secondary | ICD-10-CM | POA: Diagnosis present

## 2021-09-18 DIAGNOSIS — R002 Palpitations: Secondary | ICD-10-CM | POA: Diagnosis not present

## 2021-09-18 DIAGNOSIS — Z87891 Personal history of nicotine dependence: Secondary | ICD-10-CM | POA: Insufficient documentation

## 2021-09-18 DIAGNOSIS — Z6836 Body mass index (BMI) 36.0-36.9, adult: Secondary | ICD-10-CM | POA: Insufficient documentation

## 2021-09-18 DIAGNOSIS — E785 Hyperlipidemia, unspecified: Secondary | ICD-10-CM | POA: Diagnosis not present

## 2021-09-18 DIAGNOSIS — I10 Essential (primary) hypertension: Secondary | ICD-10-CM | POA: Insufficient documentation

## 2021-09-18 DIAGNOSIS — Z888 Allergy status to other drugs, medicaments and biological substances status: Secondary | ICD-10-CM | POA: Diagnosis not present

## 2021-09-18 HISTORY — PX: RIGHT/LEFT HEART CATH AND CORONARY ANGIOGRAPHY: CATH118266

## 2021-09-18 LAB — POCT I-STAT 7, (LYTES, BLD GAS, ICA,H+H)
Acid-base deficit: 2 mmol/L (ref 0.0–2.0)
Bicarbonate: 24.6 mmol/L (ref 20.0–28.0)
Calcium, Ion: 1.11 mmol/L — ABNORMAL LOW (ref 1.15–1.40)
HCT: 35 % — ABNORMAL LOW (ref 36.0–46.0)
Hemoglobin: 11.9 g/dL — ABNORMAL LOW (ref 12.0–15.0)
O2 Saturation: 89 %
Potassium: 3.7 mmol/L (ref 3.5–5.1)
Sodium: 144 mmol/L (ref 135–145)
TCO2: 26 mmol/L (ref 22–32)
pCO2 arterial: 46.6 mmHg (ref 32.0–48.0)
pH, Arterial: 7.331 — ABNORMAL LOW (ref 7.350–7.450)
pO2, Arterial: 61 mmHg — ABNORMAL LOW (ref 83.0–108.0)

## 2021-09-18 LAB — POCT I-STAT EG7
Acid-Base Excess: 0 mmol/L (ref 0.0–2.0)
Acid-base deficit: 1 mmol/L (ref 0.0–2.0)
Bicarbonate: 26.5 mmol/L (ref 20.0–28.0)
Bicarbonate: 25.3 mmol/L (ref 20.0–28.0)
Calcium, Ion: 1.24 mmol/L (ref 1.15–1.40)
Calcium, Ion: 1.08 mmol/L — ABNORMAL LOW (ref 1.15–1.40)
HCT: 38 % (ref 36.0–46.0)
HCT: 35 % — ABNORMAL LOW (ref 36.0–46.0)
Hemoglobin: 12.9 g/dL (ref 12.0–15.0)
Hemoglobin: 11.9 g/dL — ABNORMAL LOW (ref 12.0–15.0)
O2 Saturation: 71 %
O2 Saturation: 65 %
Potassium: 4.1 mmol/L (ref 3.5–5.1)
Potassium: 3.6 mmol/L (ref 3.5–5.1)
Sodium: 143 mmol/L (ref 135–145)
Sodium: 145 mmol/L (ref 135–145)
TCO2: 28 mmol/L (ref 22–32)
TCO2: 27 mmol/L (ref 22–32)
pCO2, Ven: 51.7 mmHg (ref 44.0–60.0)
pCO2, Ven: 49.5 mmHg (ref 44.0–60.0)
pH, Ven: 7.318 (ref 7.250–7.430)
pH, Ven: 7.318 (ref 7.250–7.430)
pO2, Ven: 41 mmHg (ref 32.0–45.0)
pO2, Ven: 37 mmHg (ref 32.0–45.0)

## 2021-09-18 LAB — GLUCOSE, CAPILLARY: Glucose-Capillary: 124 mg/dL — ABNORMAL HIGH (ref 70–99)

## 2021-09-18 SURGERY — RIGHT/LEFT HEART CATH AND CORONARY ANGIOGRAPHY
Anesthesia: LOCAL

## 2021-09-18 MED ORDER — ACETAMINOPHEN 325 MG PO TABS
650.0000 mg | ORAL_TABLET | ORAL | Status: DC | PRN
Start: 2021-09-18 — End: 2021-09-18

## 2021-09-18 MED ORDER — SODIUM CHLORIDE 0.9% FLUSH: 3.0000 mL | Freq: Two times a day (BID) | INTRAVENOUS | Status: DC

## 2021-09-18 MED ORDER — FENTANYL CITRATE (PF) 100 MCG/2ML IJ SOLN
INTRAMUSCULAR | Status: DC | PRN
  Administered 2021-09-18: 25 ug via INTRAVENOUS

## 2021-09-18 MED ORDER — MIDAZOLAM HCL 2 MG/2ML IJ SOLN
INTRAMUSCULAR | Status: AC
  Filled 2021-09-18: qty 2

## 2021-09-18 MED ORDER — LABETALOL HCL 5 MG/ML IV SOLN: 10.0000 mg | INTRAVENOUS | Status: DC | PRN

## 2021-09-18 MED ORDER — HYDRALAZINE HCL 20 MG/ML IJ SOLN: 10.0000 mg | INTRAMUSCULAR | Status: DC | PRN

## 2021-09-18 MED ORDER — ASPIRIN 81 MG PO CHEW: 81.0000 mg | CHEWABLE_TABLET | ORAL | Status: DC

## 2021-09-18 MED ORDER — HEPARIN SODIUM (PORCINE) 1000 UNIT/ML IJ SOLN
INTRAMUSCULAR | Status: AC
  Filled 2021-09-18: qty 1

## 2021-09-18 MED ORDER — MIDAZOLAM HCL 2 MG/2ML IJ SOLN
INTRAMUSCULAR | Status: DC | PRN
  Administered 2021-09-18: 1 mg via INTRAVENOUS

## 2021-09-18 MED ORDER — SODIUM CHLORIDE 0.9% FLUSH: 3.0000 mL | INTRAVENOUS | Status: DC | PRN

## 2021-09-18 MED ORDER — SODIUM CHLORIDE 0.9 % IV SOLN: 250.0000 mL | INTRAVENOUS | Status: DC | PRN

## 2021-09-18 MED ORDER — HEPARIN (PORCINE) IN NACL 1000-0.9 UT/500ML-% IV SOLN
INTRAVENOUS | Status: AC
  Filled 2021-09-18: qty 1000

## 2021-09-18 MED ORDER — LIDOCAINE HCL (PF) 1 % IJ SOLN
INTRAMUSCULAR | Status: AC
  Filled 2021-09-18: qty 30

## 2021-09-18 MED ORDER — SODIUM CHLORIDE 0.9 % WEIGHT BASED INFUSION
1.0000 mL/kg/h | INTRAVENOUS | Status: DC
  Administered 2021-09-18: 1 mL/kg/h via INTRAVENOUS

## 2021-09-18 MED ORDER — ONDANSETRON HCL 4 MG/2ML IJ SOLN: 4.0000 mg | Freq: Four times a day (QID) | INTRAMUSCULAR | Status: DC | PRN

## 2021-09-18 MED ORDER — LIDOCAINE HCL (PF) 1 % IJ SOLN
INTRAMUSCULAR | Status: DC | PRN
  Administered 2021-09-18: 7 mL via INTRADERMAL

## 2021-09-18 MED ORDER — HEPARIN SODIUM (PORCINE) 1000 UNIT/ML IJ SOLN
INTRAMUSCULAR | Status: DC | PRN
  Administered 2021-09-18: 5000 [IU] via INTRAVENOUS

## 2021-09-18 MED ORDER — FENTANYL CITRATE (PF) 100 MCG/2ML IJ SOLN
INTRAMUSCULAR | Status: AC
  Filled 2021-09-18: qty 2

## 2021-09-18 MED ORDER — SODIUM CHLORIDE 0.9 % WEIGHT BASED INFUSION
3.0000 mL/kg/h | INTRAVENOUS | Status: AC
  Administered 2021-09-18: 3 mL/kg/h via INTRAVENOUS

## 2021-09-18 MED ORDER — VERAPAMIL HCL 2.5 MG/ML IV SOLN
INTRAVENOUS | Status: AC
  Filled 2021-09-18: qty 2

## 2021-09-18 SURGICAL SUPPLY — 12 items
CATH BALLN WEDGE 5F 110CM (CATHETERS) ×2 IMPLANT
CATH OPTITORQUE TIG 4.0 5F (CATHETERS) ×2 IMPLANT
DEVICE RAD COMP TR BAND LRG (VASCULAR PRODUCTS) ×2 IMPLANT
GLIDESHEATH SLEND A-KIT 6F 22G (SHEATH) ×4 IMPLANT
GUIDEWIRE INQWIRE 1.5J.035X260 (WIRE) ×1 IMPLANT
INQWIRE 1.5J .035X260CM (WIRE) ×2
KIT HEART LEFT (KITS) ×2 IMPLANT
PACK CARDIAC CATHETERIZATION (CUSTOM PROCEDURE TRAY) ×2 IMPLANT
SHEATH GLIDE SLENDER 4/5FR (SHEATH) ×2 IMPLANT
TRANSDUCER W/STOPCOCK (MISCELLANEOUS) ×2 IMPLANT
TUBING CIL FLEX 10 FLL-RA (TUBING) ×2 IMPLANT
WIRE EMERALD 3MM-J .025X260CM (WIRE) ×2 IMPLANT

## 2021-09-18 NOTE — Interval H&P Note (Signed)
History and Physical Interval Note:  09/18/2021 7:36 AM  Erin Carlson  has presented today for surgery, with the diagnosis of chest pain.  The various methods of treatment have been discussed with the patient and family. After consideration of risks, benefits and other options for treatment, the patient has consented to  Procedure(s): RIGHT/LEFT HEART CATH AND CORONARY ANGIOGRAPHY (N/A) as a surgical intervention.  The patient's history has been reviewed, patient examined, no change in status, stable for surgery.  I have reviewed the patient's chart and labs.  Questions were answered to the patient's satisfaction.    Cath Lab Visit (complete for each Cath Lab visit)  Clinical Evaluation Leading to the Procedure:   ACS: No.  Non-ACS:    Anginal Classification: CCS II  Anti-ischemic medical therapy: Maximal Therapy (2 or more classes of medications)  Non-Invasive Test Results: No non-invasive testing performed  Prior CABG: No previous CABG        Early Osmond

## 2021-09-18 NOTE — Progress Notes (Signed)
Patient and daughter was given discharge instructions. Both verbalized understanding. 

## 2021-09-18 NOTE — Discharge Instructions (Signed)

## 2021-09-19 MED FILL — Verapamil HCl IV Soln 2.5 MG/ML: INTRAVENOUS | Qty: 2 | Status: AC

## 2021-09-19 MED FILL — Heparin Sod (Porcine)-NaCl IV Soln 1000 Unit/500ML-0.9%: INTRAVENOUS | Qty: 500 | Status: AC

## 2021-09-21 DIAGNOSIS — R002 Palpitations: Secondary | ICD-10-CM

## 2021-10-05 ENCOUNTER — Telehealth: Payer: Self-pay | Admitting: Cardiology

## 2021-10-05 NOTE — Telephone Encounter (Signed)
Patient is returning call to discuss monitor results. 

## 2021-10-05 NOTE — Telephone Encounter (Signed)
Called patient. Patient made aware of her monitor results. Verbalized understanding. No questions or concerns expressed at this time.

## 2021-10-11 DIAGNOSIS — I252 Old myocardial infarction: Secondary | ICD-10-CM | POA: Insufficient documentation

## 2021-10-11 DIAGNOSIS — M1990 Unspecified osteoarthritis, unspecified site: Secondary | ICD-10-CM | POA: Insufficient documentation

## 2021-10-11 DIAGNOSIS — I10 Essential (primary) hypertension: Secondary | ICD-10-CM | POA: Insufficient documentation

## 2021-10-11 DIAGNOSIS — F329 Major depressive disorder, single episode, unspecified: Secondary | ICD-10-CM | POA: Insufficient documentation

## 2021-10-11 DIAGNOSIS — F419 Anxiety disorder, unspecified: Secondary | ICD-10-CM | POA: Insufficient documentation

## 2021-10-11 DIAGNOSIS — G629 Polyneuropathy, unspecified: Secondary | ICD-10-CM | POA: Insufficient documentation

## 2021-10-11 DIAGNOSIS — K219 Gastro-esophageal reflux disease without esophagitis: Secondary | ICD-10-CM | POA: Insufficient documentation

## 2021-10-11 DIAGNOSIS — J449 Chronic obstructive pulmonary disease, unspecified: Secondary | ICD-10-CM | POA: Insufficient documentation

## 2021-10-11 DIAGNOSIS — C801 Malignant (primary) neoplasm, unspecified: Secondary | ICD-10-CM | POA: Insufficient documentation

## 2021-10-11 DIAGNOSIS — E119 Type 2 diabetes mellitus without complications: Secondary | ICD-10-CM | POA: Insufficient documentation

## 2021-10-11 DIAGNOSIS — J309 Allergic rhinitis, unspecified: Secondary | ICD-10-CM | POA: Insufficient documentation

## 2021-10-11 DIAGNOSIS — E669 Obesity, unspecified: Secondary | ICD-10-CM | POA: Insufficient documentation

## 2021-10-11 DIAGNOSIS — E785 Hyperlipidemia, unspecified: Secondary | ICD-10-CM | POA: Insufficient documentation

## 2021-10-11 DIAGNOSIS — Z87442 Personal history of urinary calculi: Secondary | ICD-10-CM | POA: Insufficient documentation

## 2021-10-11 DIAGNOSIS — L719 Rosacea, unspecified: Secondary | ICD-10-CM | POA: Insufficient documentation

## 2021-10-11 DIAGNOSIS — I739 Peripheral vascular disease, unspecified: Secondary | ICD-10-CM | POA: Insufficient documentation

## 2021-10-11 DIAGNOSIS — E559 Vitamin D deficiency, unspecified: Secondary | ICD-10-CM | POA: Insufficient documentation

## 2021-10-19 ENCOUNTER — Other Ambulatory Visit: Payer: Self-pay

## 2021-10-19 ENCOUNTER — Ambulatory Visit (INDEPENDENT_AMBULATORY_CARE_PROVIDER_SITE_OTHER): Payer: Medicare Other | Admitting: Cardiology

## 2021-10-19 ENCOUNTER — Encounter: Payer: Self-pay | Admitting: Cardiology

## 2021-10-19 VITALS — BP 132/72 | HR 66 | Ht 63.0 in | Wt 210.0 lb

## 2021-10-19 DIAGNOSIS — R002 Palpitations: Secondary | ICD-10-CM

## 2021-10-19 DIAGNOSIS — I10 Essential (primary) hypertension: Secondary | ICD-10-CM

## 2021-10-19 DIAGNOSIS — I2510 Atherosclerotic heart disease of native coronary artery without angina pectoris: Secondary | ICD-10-CM

## 2021-10-19 DIAGNOSIS — E782 Mixed hyperlipidemia: Secondary | ICD-10-CM

## 2021-10-19 DIAGNOSIS — G4733 Obstructive sleep apnea (adult) (pediatric): Secondary | ICD-10-CM | POA: Insufficient documentation

## 2021-10-19 DIAGNOSIS — E119 Type 2 diabetes mellitus without complications: Secondary | ICD-10-CM

## 2021-10-19 NOTE — Addendum Note (Signed)
Addended by: Senaida Ores on: 10/19/2021 01:41 PM   Modules accepted: Orders

## 2021-10-19 NOTE — Patient Instructions (Signed)
Medication Instructions:  Your physician recommends that you continue on your current medications as directed. Please refer to the Current Medication list given to you today.  *If you need a refill on your cardiac medications before your next appointment, please call your pharmacy*   Lab Work: Your physician recommends that you return for lab work today: lipid  If you have labs (blood work) drawn today and your tests are completely normal, you will receive your results only by: . MyChart Message (if you have MyChart) OR . A paper copy in the mail If you have any lab test that is abnormal or we need to change your treatment, we will call you to review the results.   Testing/Procedures: None   Follow-Up: At [facility], you and your health needs are our priority.  As part of our continuing mission to provide you with exceptional heart care, we have created designated Provider Care Teams.  These Care Teams include your primary Cardiologist (physician) and Advanced Practice Providers (APPs -  Physician Assistants and Nurse Practitioners) who all work together to provide you with the care you need, when you need it.  We recommend signing up for the patient portal called "MyChart".  Sign up information is provided on this After Visit Summary.  MyChart is used to connect with patients for Virtual Visits (Telemedicine).  Patients are able to view lab/test results, encounter notes, upcoming appointments, etc.  Non-urgent messages can be sent to your provider as well.   To learn more about what you can do with MyChart, go to [email or web address].    Your next appointment:   5 month(s)  The format for your next appointment:   In Person  Provider:   Robert Krasowski, MD   Other Instructions    

## 2021-10-19 NOTE — Progress Notes (Addendum)
Cardiology Office Note:    Date:  10/19/2021   ID:  Erin Carlson, DOB 10/13/1951, MRN [id number]  PCP:  Imagene Riches, NP  Cardiologist:  Jenne Campus, MD    Referring MD: Imagene Riches, NP   Chief Complaint  Patient presents with   Follow-up  I am doing much better  History of Present Illness:    Erin Carlson is a 70 y.o. female who was evaluated by my partner Dr. Harriet Masson for symptoms of dyspnea on exertion, fatigue tiredness as well as atypical chest pain.  That evaluation included left and right side cardiac catheterization to be performed just few weeks ago.  She was identified to have up to 30% RCA, distal left main lesion as well as mid LAD lesion.  Right-sided cardiac catheterization showed normal coronaries with mildly elevated pulmonary pressure. She comes today to my office to talk about this overall she says she is doing better but still complain of having fatigue tiredness and shortness of breath.  She denies have any chest pain tightness squeezing pressure burning chest she said since the time of cardiac catheterization feels better.  Past Medical History:  Diagnosis Date   Abnormal laboratory test 06/20/2016   Allergic rhinitis    Anxiety    Arthritis    Cancer ([place])    basal cell 2016   COPD (chronic obstructive pulmonary disease) (HCC)    Diabetes mellitus without complication ([place])    Dizziness 03/19/2016   Essential hypertension    Fibromyalgia 03/19/2016   GERD without esophagitis    History of kidney stones    10/2016   Hyperlipidemia    Major depressive disorder    Obesity    Old myocardial infarction    Palpitations 03/19/2016   Peripheral vascular disease ([place])    Polyneuropathy    Rosacea    Shortness of breath 03/19/2016   Vitamin D deficiency     Past Surgical History:  Procedure Laterality Date   ABDOMINAL HYSTERECTOMY     1989   APPENDECTOMY     CARPAL TUNNEL RELEASE Left 02/2021   [place]    EYE SURGERY  10/2017   EYE SURGERY Bilateral 03/2020   [place]   NASAL SEPTUM SURGERY     2018    RIGHT/LEFT HEART CATH AND CORONARY ANGIOGRAPHY N/A 09/18/2021   Procedure: RIGHT/LEFT HEART CATH AND CORONARY ANGIOGRAPHY;  Surgeon: Early Osmond, MD;  Location: [place] [facility];  Service: Cardiovascular;  Laterality: N/A;   SKIN SURGERY  2013   Basal Cell Left side Nare   SKIN SURGERY  2020   Face   SPINAL CORD STIMULATOR INSERTION N/A 11/01/2017   Procedure: LUMBAR SPINAL CORD STIMULATOR INSERTION;  Surgeon: Clydell Hakim, MD;  Location: [place];  Service: Neurosurgery;  Laterality: N/A;  LUMBAR SPINAL CORD STIMULATOR INSERTION   TENNIS ELBOW RELEASE/NIRSCHEL PROCEDURE     1988    Current Medications: Current Meds  Medication Sig   acetaminophen (TYLENOL) 650 MG CR tablet Take 1,300 mg by mouth daily as needed for pain.   albuterol (VENTOLIN HFA) 108 (90 Base) MCG/ACT inhaler Inhale 2 puffs into the lungs every 6 (six) hours as needed for wheezing or shortness of breath.    aspirin EC 81 MG tablet Take 81 mg by mouth daily.   BYDUREON BCISE 2 MG/0.85ML AUIJ Inject 2 mg into the skin every Sunday.   Calcium Carb-Cholecalciferol (CALCIUM 600 + D PO) Take 1  tablet by mouth daily.   cyclobenzaprine (FLEXERIL) 10 MG tablet Take 10 mg by mouth at bedtime.   FLUoxetine (PROZAC) 20 MG capsule Take 20 mg by mouth daily.   losartan (COZAAR) 50 MG tablet Take 50 mg by mouth daily.   metoprolol tartrate (LOPRESSOR) 25 MG tablet Take 25 mg by mouth 2 (two) times daily.   Multiple Vitamin (MULTIVITAMIN WITH MINERALS) TABS tablet Take 1 tablet by mouth daily.   nitroGLYCERIN (NITROSTAT) 0.4 MG SL tablet Place 1 tablet (0.4 mg total) under the tongue every 5 (five) minutes as needed for chest pain.   Omega-3 Fatty Acids (FISH OIL) 1200 MG CAPS Take 1,200 mg by mouth daily.   omeprazole (PRILOSEC) 40 MG capsule Take 40 mg by mouth daily.   pravastatin (PRAVACHOL) 20 MG tablet Take 20 mg by mouth  daily.   pregabalin (LYRICA) 100 MG capsule Take 100 mg by mouth 2 (two) times daily.     Allergies:   Cortisone, Hydrocodone, Oxycodone, Paxil [paroxetine hcl], and Tramadol   Social History   Socioeconomic History   Marital status: Divorced    Spouse name: Not on file   Number of children: Not on file   Years of education: Not on file   Highest education level: Not on file  Occupational History   Not on file  Tobacco Use   Smoking status: Former    Packs/day: 1.50    Years: 20.00    Pack years: 30.00    Types: Cigarettes    Quit date: 2007    Years since quitting: 15.8   Smokeless tobacco: Never  Vaping Use   Vaping Use: Never used  Substance and Sexual Activity   Alcohol use: No   Drug use: No   Sexual activity: Not on file  Other Topics Concern   Not on file  Social History Narrative   Not on file   Social Determinants of Health   Financial Resource Strain: Not on file  Food Insecurity: Not on file  Transportation Needs: Not on file  Physical Activity: Not on file  Stress: Not on file  Social Connections: Not on file     Family History: The patient's family history includes Colon cancer in her brother and father; Diabetes in her maternal grandfather and maternal grandmother; Heart disease in her father; Hypertension in her brother and brother; Stroke in her mother and sister. ROS:   Please see the history of present illness.    All 14 point review of systems negative except as described per history of present illness  EKGs/Labs/Other Studies Reviewed:      Recent Labs: 09/14/2021: BUN 13; Creatinine, Ser 0.80; Magnesium 2.0; Platelets 205 09/18/2021: Hemoglobin 11.9; Potassium 3.6; Sodium 145  Recent Lipid Panel No results found for: CHOL, TRIG, HDL, CHOLHDL, VLDL, LDLCALC, LDLDIRECT  Physical Exam:    VS:  BP 132/72 (BP Location: Left Arm, Patient Position: Sitting, Cuff Size: Normal)   Pulse 66   Ht 5\' 3"  (1.6 m)   Wt 210 lb (95.3 kg)   SpO2 95%    BMI 37.20 kg/m     Wt Readings from Last 3 Encounters:  10/19/21 210 lb (95.3 kg)  09/18/21 208 lb (94.3 kg)  09/14/21 208 lb 12.8 oz (94.7 kg)     GEN:  Well nourished, well developed in no acute distress HEENT: Normal NECK: No JVD; No carotid bruits LYMPHATICS: No lymphadenopathy CARDIAC: RRR, no murmurs, no rubs, no gallops RESPIRATORY:  Clear to auscultation without rales,  wheezing or rhonchi  ABDOMEN: Soft, non-tender, non-distended MUSCULOSKELETAL:  No edema; No deformity  SKIN: Warm and dry LOWER EXTREMITIES: no swelling NEUROLOGIC:  Alert and oriented x 3 PSYCHIATRIC:  Normal affect   ASSESSMENT:    1. Essential hypertension   2. Coronary artery disease involving native coronary artery of native heart without angina pectoris   3. Diabetes mellitus without complication (HCC)   4. Palpitations   5. Mixed hyperlipidemia   6. Obstructive sleep apnea    PLAN:    In order of problems listed above:  Coronary disease we did review cardiac catheterization in details I told her even though there is no critical obstructive lesions she does have coronary artery disease which need to be aggressively managed.  She is already on antiplatelet therapy which I will continue.  I will check fasting lipid profile today and will most likely have to augment her therapy. Essential hypertension blood pressure well controlled continue present management Palpitations event recorder reviewed showed no significant abnormalities. Mixed dyslipidemia, again fasting lipid profile will be repeated. 5.  I strongly suspect she got severe sleep apnea and she describes to worsen when she wakes up in the middle of night with tachycardia or weight chest heaviness.  She is already scheduled to have a sleep study I am anxiously waiting for results of the test  Medication Adjustments/Labs and Tests Ordered: Current medicines are reviewed at length with the patient today.  Concerns regarding medicines are  outlined above.  No orders of the defined types were placed in this encounter.  Medication changes: No orders of the defined types were placed in this encounter.   Signed, Park Liter, MD, [facility] 10/19/2021 1:23 PM    [place]

## 2021-10-20 LAB — LIPID PANEL
Chol/HDL Ratio: 5 ratio — ABNORMAL HIGH (ref 0.0–4.4)
Cholesterol, Total: 159 mg/dL (ref 100–199)
HDL: 32 mg/dL — ABNORMAL LOW (ref >39)
LDL Chol Calc (NIH): 81 mg/dL (ref 0–99)
Triglycerides: 278 mg/dL — ABNORMAL HIGH (ref 0–149)
VLDL Cholesterol Cal: 46 mg/dL — ABNORMAL HIGH (ref 5–40)

## 2021-10-24 ENCOUNTER — Telehealth: Payer: Self-pay | Admitting: Emergency Medicine

## 2021-10-24 DIAGNOSIS — E782 Mixed hyperlipidemia: Secondary | ICD-10-CM

## 2021-10-24 MED ORDER — PRAVASTATIN SODIUM 40 MG PO TABS: 40.0000 mg | ORAL_TABLET | Freq: Every day | ORAL | 1 refills | Status: DC

## 2021-10-24 NOTE — Telephone Encounter (Signed)
-----   Message from Park Liter, MD sent at 10/24/2021  2:28 PM EDT ----- Cholesterol still not good, please increase pravastatin from 20 mg daily to 40 mg daily, fasting lipid profile, AST ALT need to be done 6 weeks

## 2021-10-24 NOTE — Telephone Encounter (Signed)
Spoke to patient informed her of results. She will increase pravastatin to 40 mg daily. She will repeat labs in 6 weeks fasting no further questions.

## 2022-03-23 ENCOUNTER — Ambulatory Visit: Payer: Medicare Other | Admitting: Cardiology

## 2022-04-03 ENCOUNTER — Other Ambulatory Visit: Payer: Self-pay | Admitting: Cardiology

## 2022-06-18 ENCOUNTER — Ambulatory Visit (INDEPENDENT_AMBULATORY_CARE_PROVIDER_SITE_OTHER): Payer: Medicare Other | Admitting: Cardiology

## 2022-06-18 ENCOUNTER — Encounter: Payer: Self-pay | Admitting: Cardiology

## 2022-06-18 VITALS — BP 126/68 | HR 80 | Ht 63.0 in | Wt 204.8 lb

## 2022-06-18 DIAGNOSIS — I10 Essential (primary) hypertension: Secondary | ICD-10-CM

## 2022-06-18 DIAGNOSIS — E119 Type 2 diabetes mellitus without complications: Secondary | ICD-10-CM | POA: Diagnosis not present

## 2022-06-18 DIAGNOSIS — I739 Peripheral vascular disease, unspecified: Secondary | ICD-10-CM | POA: Diagnosis not present

## 2022-06-18 DIAGNOSIS — I2510 Atherosclerotic heart disease of native coronary artery without angina pectoris: Secondary | ICD-10-CM | POA: Diagnosis not present

## 2022-06-18 DIAGNOSIS — E782 Mixed hyperlipidemia: Secondary | ICD-10-CM

## 2022-08-05 ENCOUNTER — Other Ambulatory Visit: Payer: Self-pay | Admitting: Cardiology

## 2022-08-06 NOTE — Telephone Encounter (Signed)
Pravastatin 40 mg # 90 x 1 refill sent to  [id number]- [place], [place] - [address]

## 2022-10-03 DIAGNOSIS — M5416 Radiculopathy, lumbar region: Secondary | ICD-10-CM | POA: Diagnosis not present

## 2022-10-03 DIAGNOSIS — G629 Polyneuropathy, unspecified: Secondary | ICD-10-CM | POA: Diagnosis not present

## 2022-10-03 DIAGNOSIS — M5418 Radiculopathy, sacral and sacrococcygeal region: Secondary | ICD-10-CM | POA: Diagnosis not present

## 2022-10-03 DIAGNOSIS — M25551 Pain in right hip: Secondary | ICD-10-CM | POA: Diagnosis not present

## 2022-12-06 DIAGNOSIS — E118 Type 2 diabetes mellitus with unspecified complications: Secondary | ICD-10-CM | POA: Diagnosis not present

## 2022-12-06 DIAGNOSIS — M5416 Radiculopathy, lumbar region: Secondary | ICD-10-CM | POA: Diagnosis not present

## 2022-12-06 DIAGNOSIS — M25511 Pain in right shoulder: Secondary | ICD-10-CM | POA: Diagnosis not present

## 2022-12-06 DIAGNOSIS — I10 Essential (primary) hypertension: Secondary | ICD-10-CM | POA: Diagnosis not present

## 2023-01-04 ENCOUNTER — Other Ambulatory Visit: Payer: Self-pay | Admitting: Family

## 2023-01-04 DIAGNOSIS — Z1231 Encounter for screening mammogram for malignant neoplasm of breast: Secondary | ICD-10-CM

## 2023-01-08 ENCOUNTER — Other Ambulatory Visit: Payer: Self-pay | Admitting: Family

## 2023-01-08 DIAGNOSIS — E2839 Other primary ovarian failure: Secondary | ICD-10-CM

## 2023-01-14 ENCOUNTER — Ambulatory Visit
Admission: RE | Admit: 2023-01-14 | Discharge: 2023-01-14 | Disposition: A | Discharge status: Home / Self Care | Payer: 59 | Source: Ambulatory Visit | Attending: Family | Admitting: Family

## 2023-01-14 DIAGNOSIS — E2839 Other primary ovarian failure: Secondary | ICD-10-CM

## 2023-01-14 DIAGNOSIS — Z1231 Encounter for screening mammogram for malignant neoplasm of breast: Secondary | ICD-10-CM

## 2023-01-17 ENCOUNTER — Other Ambulatory Visit: Payer: Self-pay | Admitting: Family

## 2023-01-17 DIAGNOSIS — R928 Other abnormal and inconclusive findings on diagnostic imaging of breast: Secondary | ICD-10-CM

## 2023-01-25 ENCOUNTER — Ambulatory Visit: Payer: 59 | Attending: Cardiology | Admitting: Cardiology

## 2023-01-25 ENCOUNTER — Encounter: Payer: Self-pay | Admitting: Cardiology

## 2023-01-25 VITALS — BP 118/74 | HR 73 | Ht 63.0 in | Wt 199.8 lb

## 2023-01-25 DIAGNOSIS — I10 Essential (primary) hypertension: Secondary | ICD-10-CM

## 2023-01-25 DIAGNOSIS — E782 Mixed hyperlipidemia: Secondary | ICD-10-CM

## 2023-01-25 DIAGNOSIS — E119 Type 2 diabetes mellitus without complications: Secondary | ICD-10-CM

## 2023-01-25 DIAGNOSIS — I2510 Atherosclerotic heart disease of native coronary artery without angina pectoris: Secondary | ICD-10-CM | POA: Diagnosis not present

## 2023-01-25 NOTE — Progress Notes (Signed)
Cardiology Office Note:    Date:  01/25/2023   ID:  Erin Carlson, DOB Feb 26, 1951, MRN [id number]  PCP:  Rhea Bleacher, NP  Cardiologist:  Jenne Campus, MD    Referring MD: Rhea Bleacher, NP   Chief Complaint  Patient presents with   Follow-up    History of Present Illness:    Erin Carlson is a 72 y.o. female with past medical history significant for coronary artery disease, last year she had cardiac catheterization done which showed 30% left main, 30% mid LAD, 30% proximal RCA, additional problem include essential hypertension, fibromyalgia, dyslipidemia, COPD.  She is in my office today for follow-up.  Last time I spoke to her she was very busy she was taking care of 11 children now likely she does not have to do it.  She is feeling better but she noticeably more shortness of breath and she blames this on the season because of a lot of eating and lack of exercises that she probably is right.  I approach with possibility of investigation this problem but she does not want to do it she said she is going to BL be more active and see how she thinks will go  Past Medical History:  Diagnosis Date   Abnormal laboratory test 06/20/2016   Allergic rhinitis    Anxiety    Arthritis    Cancer ([place])    basal cell 2016   COPD (chronic obstructive pulmonary disease) ([place])    Diabetes mellitus without complication ([place])    Dizziness 03/19/2016   Essential hypertension    Fibromyalgia 03/19/2016   GERD without esophagitis    History of kidney stones    10/2016   Hyperlipidemia    Major depressive disorder    Obesity    Old myocardial infarction    Palpitations 03/19/2016   Peripheral vascular disease ([place])    Polyneuropathy    Rosacea    Shortness of breath 03/19/2016   Vitamin D deficiency     Past Surgical History:  Procedure Laterality Date   ABDOMINAL HYSTERECTOMY     1989   APPENDECTOMY     CARPAL TUNNEL RELEASE Left 02/2021   [place]   EYE SURGERY  10/2017   EYE SURGERY Bilateral 03/2020   Impl[place]   NASAL SEPTUM SURGERY     2018    RIGHT/LEFT HEART CATH AND CORONARY ANGIOGRAPHY N/A 09/18/2021   Procedure: RIGHT/LEFT HEART CATH AND CORONARY ANGIOGRAPHY;  Surgeon: Early Osmond, MD;  Location: [place] [facility];  Service: Cardiovascular;  Laterality: N/A;   SKIN SURGERY  2013   Basal Cell Left side Nare   SKIN SURGERY  2020   Face   SPINAL CORD STIMULATOR INSERTION N/A 11/01/2017   Procedure: LUMBAR SPINAL CORD STIMULATOR INSERTION;  Surgeon: Clydell Hakim, MD;  Location: [place];  Service: Neurosurgery;  Laterality: N/A;  LUMBAR SPINAL CORD STIMULATOR INSERTION   TENNIS ELBOW RELEASE/NIRSCHEL PROCEDURE     1988    Current Medications: Current Meds  Medication Sig   acetaminophen (TYLENOL) 650 MG CR tablet Take 1,300 mg by mouth daily as needed for pain.   albuterol (VENTOLIN HFA) 108 (90 Base) MCG/ACT inhaler Inhale 2 puffs into the lungs every 6 (six) hours as needed for wheezing or shortness of breath.    aspirin EC 81 MG tablet Take 81 mg by mouth daily.   BYDUREON BCISE 2 MG/0.85ML AUIJ Inject 2 mg into the  skin every Sunday.   Calcium Carb-Cholecalciferol (CALCIUM 600 + D PO) Take 1 tablet by mouth daily.   cyclobenzaprine (FLEXERIL) 10 MG tablet Take 10 mg by mouth at bedtime.   FLUoxetine (PROZAC) 20 MG capsule Take 20 mg by mouth daily.   losartan (COZAAR) 50 MG tablet Take 50 mg by mouth daily.   metoprolol tartrate (LOPRESSOR) 25 MG tablet Take 25 mg by mouth 2 (two) times daily.   Multiple Vitamin (MULTIVITAMIN WITH MINERALS) TABS tablet Take 1 tablet by mouth daily.   nitroGLYCERIN (NITROSTAT) 0.4 MG SL tablet Place 1 tablet (0.4 mg total) under the tongue every 5 (five) minutes as needed for chest pain.   omeprazole (PRILOSEC) 40 MG capsule Take 40 mg by mouth daily.   pravastatin (PRAVACHOL) 40 MG tablet TAKE 1 TABLET BY MOUTH EVERY DAY (Patient taking differently: Take 40 mg by mouth daily.)    pregabalin (LYRICA) 100 MG capsule Take 100 mg by mouth 2 (two) times daily.     Allergies:   Cortisone, Hydrocodone, Oxycodone, Paxil [paroxetine hcl], and Tramadol   Social History   Socioeconomic History   Marital status: Divorced    Spouse name: Not on file   Number of children: Not on file   Years of education: Not on file   Highest education level: Not on file  Occupational History   Not on file  Tobacco Use   Smoking status: Former    Packs/day: 1.50    Years: 20.00    Total pack years: 30.00    Types: Cigarettes    Quit date: 2007    Years since quitting: 17.0   Smokeless tobacco: Never  Vaping Use   Vaping Use: Never used  Substance and Sexual Activity   Alcohol use: No   Drug use: No   Sexual activity: Not on file  Other Topics Concern   Not on file  Social History Narrative   Not on file   Social Determinants of Health   Financial Resource Strain: Not on file  Food Insecurity: Not on file  Transportation Needs: Not on file  Physical Activity: Not on file  Stress: Not on file  Social Connections: Not on file     Family History: The patient's family history includes Colon cancer in her brother and father; Diabetes in her maternal grandfather and maternal grandmother; Heart disease in her father; Hypertension in her brother and brother; Stroke in her mother and sister. There is no history of Breast cancer. ROS:   Please see the history of present illness.    All 14 point review of systems negative except as described per history of present illness  EKGs/Labs/Other Studies Reviewed:      Recent Labs: No results found for requested labs within last 365 days.  Recent Lipid Panel    Component Value Date/Time   CHOL 159 10/19/2021 1343   TRIG 278 (H) 10/19/2021 1343   HDL 32 (L) 10/19/2021 1343   CHOLHDL 5.0 (H) 10/19/2021 1343   LDLCALC 81 10/19/2021 1343    Physical Exam:    VS:  BP 118/74 (BP Location: Left Arm, Patient Position: Sitting)    Pulse 73   Ht 5' 3"$  (1.6 m)   Wt 199 lb 12.8 oz (90.6 kg)   SpO2 93%   BMI 35.39 kg/m     Wt Readings from Last 3 Encounters:  01/25/23 199 lb 12.8 oz (90.6 kg)  06/18/22 204 lb 12.8 oz (92.9 kg)  10/19/21 210 lb (95.3 kg)  GEN:  Well nourished, well developed in no acute distress HEENT: Normal NECK: No JVD; No carotid bruits LYMPHATICS: No lymphadenopathy CARDIAC: RRR, no murmurs, no rubs, no gallops RESPIRATORY:  Clear to auscultation without rales, wheezing or rhonchi  ABDOMEN: Soft, non-tender, non-distended MUSCULOSKELETAL:  No edema; No deformity  SKIN: Warm and dry LOWER EXTREMITIES: no swelling NEUROLOGIC:  Alert and oriented x 3 PSYCHIATRIC:  Normal affect   ASSESSMENT:    1. Coronary artery disease involving native coronary artery of native heart without angina pectoris   2. Essential hypertension   3. Diabetes mellitus without complication ([place])   4. Mixed hyperlipidemia    PLAN:    In order of problems listed above:  Coronary disease.  Stable from that point review and appropriate guideline directed medical therapy which include antiplatelets therapy as well as statin. Dyslipidemia I did review her K PN which show me total cholesterol 159 HDL 32, LDL 81.  However this is all data I will call primary care physician to get more up-to-date information's Essential hypertension blood pressure well-controlled continue present management. Diabetes mellitus I do have hemoglobin A1c which is from summer 2023 which was 6.3 good control. We discussed healthy lifestyle need to exercise on the regular basis she understand that she will try to do that   Medication Adjustments/Labs and Tests Ordered: Current medicines are reviewed at length with the patient today.  Concerns regarding medicines are outlined above.  No orders of the defined types were placed in this encounter.  Medication changes: No orders of the defined types were placed in this  encounter.   Signed, Park Liter, MD, [facility] 01/25/2023 8:51 AM    [place]

## 2023-01-25 NOTE — Patient Instructions (Signed)

## 2023-02-05 ENCOUNTER — Telehealth: Payer: Self-pay | Admitting: Cardiology

## 2023-02-05 NOTE — Telephone Encounter (Signed)
[facility] is requesting visit from 01/25/23 be sent again due to first attempt being cut off mid page.

## 2023-02-05 NOTE — Telephone Encounter (Signed)
[place] office visit from 01/25/23 to [facility].

## 2024-04-10 ENCOUNTER — Ambulatory Visit: Attending: Cardiology | Admitting: Cardiology

## 2024-04-10 ENCOUNTER — Encounter: Payer: Self-pay | Admitting: Cardiology

## 2024-04-10 VITALS — BP 110/62 | HR 70 | Ht 63.0 in | Wt 191.0 lb

## 2024-04-10 DIAGNOSIS — I10 Essential (primary) hypertension: Secondary | ICD-10-CM

## 2024-04-10 DIAGNOSIS — I739 Peripheral vascular disease, unspecified: Secondary | ICD-10-CM

## 2024-04-10 DIAGNOSIS — I2510 Atherosclerotic heart disease of native coronary artery without angina pectoris: Secondary | ICD-10-CM | POA: Diagnosis not present

## 2024-04-10 DIAGNOSIS — G4733 Obstructive sleep apnea (adult) (pediatric): Secondary | ICD-10-CM | POA: Diagnosis not present

## 2024-04-10 DIAGNOSIS — R0602 Shortness of breath: Secondary | ICD-10-CM

## 2024-04-10 MED ORDER — NITROGLYCERIN 0.4 MG SL SUBL: 0.4000 mg | SUBLINGUAL_TABLET | SUBLINGUAL | 3 refills | Status: AC | PRN

## 2024-04-10 NOTE — Progress Notes (Signed)
 Cardiology Office Note:    Date:  04/10/2024   ID:  Erin Carlson, DOB 1951-04-20, MRN [id number]  PCP:  Marce Sensing, NP  Cardiologist:  Ralene Burger, MD    Referring MD: Marce Sensing, NP   Chief Complaint  Patient presents with   Follow-up    History of Present Illness:    Erin Carlson is a 73 y.o. female with past medical history significant for coronary artery disease, last year she had cardiac catheterization done which showed 30% left main, 30% mid LAD, 30% proximal RCA, additional problem include essential hypertension, fibromyalgia, dyslipidemia, COPD.  Comes to my office for follow-up overall she is doing fine still taking care of a lot of children BZM have no difficulty doing this described 1 episode that she felt very cold and have chills to check her sugars sugar was normal so we still do not know what it was  Past Medical History:  Diagnosis Date   Abnormal laboratory test 06/20/2016   Allergic rhinitis    Anxiety    Arthritis    Cancer (HCC)    basal cell 2016   COPD (chronic obstructive pulmonary disease) (HCC)    Diabetes mellitus without complication (HCC)    Dizziness 03/19/2016   Essential hypertension    Fibromyalgia 03/19/2016   GERD without esophagitis    History of kidney stones    10/2016   Hyperlipidemia    Major depressive disorder    Obesity    Old myocardial infarction    Palpitations 03/19/2016   Peripheral vascular disease (HCC)    Polyneuropathy    Rosacea    Shortness of breath 03/19/2016   Vitamin D deficiency     Past Surgical History:  Procedure Laterality Date   ABDOMINAL HYSTERECTOMY     1989   APPENDECTOMY     CARPAL TUNNEL RELEASE Left 02/2021   [facility]   CHOLECYSTECTOMY     1982   EYE SURGERY  10/2017   EYE SURGERY Bilateral 03/2020   Implants   NASAL SEPTUM SURGERY     2018    RIGHT/LEFT HEART CATH AND CORONARY ANGIOGRAPHY N/A 09/18/2021   Procedure: RIGHT/LEFT HEART CATH AND CORONARY  ANGIOGRAPHY;  Surgeon: Thukkani, Arun K, MD;  Location: [facility];  Service: Cardiovascular;  Laterality: N/A;   SKIN SURGERY  2013   Basal Cell Left side Nare   SKIN SURGERY  2020   Face   SPINAL CORD STIMULATOR INSERTION N/A 11/01/2017   Procedure: LUMBAR SPINAL CORD STIMULATOR INSERTION;  Surgeon: Gerri Kras, MD;  Location: [facility] OR;  Service: Neurosurgery;  Laterality: N/A;  LUMBAR SPINAL CORD STIMULATOR INSERTION   TENNIS ELBOW RELEASE/NIRSCHEL PROCEDURE     1988    Current Medications: Current Meds  Medication Sig   acetaminophen  (TYLENOL ) 650 MG CR tablet Take 1,300 mg by mouth daily as needed for pain.   albuterol (VENTOLIN HFA) 108 (90 Base) MCG/ACT inhaler Inhale 2 puffs into the lungs every 6 (six) hours as needed for wheezing or shortness of breath.    aspirin  EC 81 MG tablet Take 81 mg by mouth daily.   Calcium Carb-Cholecalciferol (CALCIUM 600 + D PO) Take 1 tablet by mouth daily.   cetirizine (ZYRTEC) 10 MG tablet Take 10 mg by mouth daily.   cyclobenzaprine (FLEXERIL) 10 MG tablet Take 10 mg by mouth at bedtime.   FLUoxetine (PROZAC) 40 MG capsule Take 40 mg by mouth every morning.   losartan (COZAAR) 50 MG tablet  Take 50 mg by mouth daily.   metoprolol tartrate (LOPRESSOR) 25 MG tablet Take 25 mg by mouth 2 (two) times daily.   Multiple Vitamin (MULTIVITAMIN WITH MINERALS) TABS tablet Take 1 tablet by mouth daily.   Omega-3 Fatty Acids (FISH OIL) 1200 MG CAPS Take 1,200 mg by mouth daily.   omeprazole (PRILOSEC) 40 MG capsule Take 40 mg by mouth daily.   OZEMPIC, 2 MG/DOSE, 8 MG/3ML SOPN Inject 2 mg into the skin once a week.   pravastatin  (PRAVACHOL ) 40 MG tablet TAKE 1 TABLET BY MOUTH EVERY DAY   pregabalin (LYRICA) 100 MG capsule Take 100 mg by mouth 2 (two) times daily.   SYMBICORT 160-4.5 MCG/ACT inhaler Inhale 2 puffs into the lungs daily.   [DISCONTINUED] nitroGLYCERIN  (NITROSTAT ) 0.4 MG SL tablet Place 1 tablet (0.4 mg total) under the tongue every 5  (five) minutes as needed for chest pain.     Allergies:   Cortisone, Hydrocodone, Oxycodone, Paxil [paroxetine hcl], and Tramadol   Social History   Socioeconomic History   Marital status: Divorced    Spouse name: Not on file   Number of children: Not on file   Years of education: Not on file   Highest education level: Not on file  Occupational History   Not on file  Tobacco Use   Smoking status: Former    Current packs/day: 0.00    Average packs/day: 1.5 packs/day for 20.0 years (30.0 ttl pk-yrs)    Types: Cigarettes    Start date: 19    Quit date: 2007    Years since quitting: 18.3   Smokeless tobacco: Never  Vaping Use   Vaping status: Never Used  Substance and Sexual Activity   Alcohol use: No   Drug use: No   Sexual activity: Not on file  Other Topics Concern   Not on file  Social History Narrative   Not on file   Social Drivers of Health   Financial Resource Strain: Not on file  Food Insecurity: Not on file  Transportation Needs: Not on file  Physical Activity: Not on file  Stress: Not on file  Social Connections: Not on file     Family History: The patient's family history includes Colon cancer in her brother and father; Diabetes in her maternal grandfather and maternal grandmother; Heart disease in her father; Hypertension in her brother and brother; Stroke in her mother and sister. There is no history of Breast cancer. ROS:   Please see the history of present illness.    All 14 point review of systems negative except as described per history of present illness  EKGs/Labs/Other Studies Reviewed:    EKG Interpretation Date/Time:  Friday April 10 2024 13:22:02 EDT Ventricular Rate:  70 PR Interval:  132 QRS Duration:  120 QT Interval:  440 QTC Calculation: 475 R Axis:   65  Text Interpretation: Normal sinus rhythm Low voltage QRS RSR' or QR pattern in V1 suggests right ventricular conduction delay Borderline ECG When compared with ECG of  01-Nov-2017 08:11, RSR' pattern in V1 is now Present Confirmed by Ralene Burger [phone]) on 04/10/2024 1:28:06 PM    Recent Labs: No results found for requested labs within last 365 days.  Recent Lipid Panel    Component Value Date/Time   CHOL 159 10/19/2021 1343   TRIG 278 (H) 10/19/2021 1343   HDL 32 (L) 10/19/2021 1343   CHOLHDL 5.0 (H) 10/19/2021 1343   LDLCALC 81 10/19/2021 1343    Physical Exam:  VS:  BP 110/62   Pulse 70   Ht 5\' 3"  (1.6 m)   Wt 191 lb (86.6 kg)   SpO2 95%   BMI 33.83 kg/m     Wt Readings from Last 3 Encounters:  04/10/24 191 lb (86.6 kg)  01/25/23 199 lb 12.8 oz (90.6 kg)  06/18/22 204 lb 12.8 oz (92.9 kg)     GEN:  Well nourished, well developed in no acute distress HEENT: Normal NECK: No JVD; No carotid bruits LYMPHATICS: No lymphadenopathy CARDIAC: RRR, no murmurs, no rubs, no gallops RESPIRATORY:  Clear to auscultation without rales, wheezing or rhonchi  ABDOMEN: Soft, non-tender, non-distended MUSCULOSKELETAL:  No edema; No deformity  SKIN: Warm and dry LOWER EXTREMITIES: no swelling NEUROLOGIC:  Alert and oriented x 3 PSYCHIATRIC:  Normal affect   ASSESSMENT:    1. Coronary artery disease involving native coronary artery of native heart without angina pectoris   2. Essential hypertension   3. Peripheral vascular disease (HCC)   4. Obstructive sleep apnea   5. SOB (shortness of breath)    PLAN:    In order of problems listed above:  Coronary disease stable from that point review on guideline directed medical therapy which I will continue. Dyslipidemia I do have data from 2022 with LDL 81 HDL 32. Essential hypertension: Well-controlled continue present management. Obstructive sleep apnea: Follow-up by internal medicine team. Dyspnea on exertion stable much better with use of bronchodilators   Medication Adjustments/Labs and Tests Ordered: Current medicines are reviewed at length with the patient today.  Concerns regarding  medicines are outlined above.  Orders Placed This Encounter  Procedures   EKG 12-Lead   Medication changes:  Meds ordered this encounter  Medications   nitroGLYCERIN  (NITROSTAT ) 0.4 MG SL tablet    Sig: Place 1 tablet (0.4 mg total) under the tongue every 5 (five) minutes as needed for chest pain.    Dispense:  25 tablet    Refill:  3    Signed, Manfred Seed, MD, [facility] 04/10/2024 1:36 PM     [facility]

## 2024-04-10 NOTE — Patient Instructions (Signed)
 Medication Instructions:  Your physician recommends that you continue on your current medications as directed. Please refer to the Current Medication list given to you today.  *If you need a refill on your cardiac medications before your next appointmen

## 2024-10-03 LAB — LAB REPORT - SCANNED
A1c: 5.8
EGFR: 73.2
